# Patient Record
Sex: Male | Born: 1995
Health system: Southern US, Community
[De-identification: ages and names within clinical notes are randomized; demographics above are authoritative.]

## PROBLEM LIST (undated history)

## (undated) DIAGNOSIS — K469 Unspecified abdominal hernia without obstruction or gangrene: Secondary | ICD-10-CM

## (undated) DIAGNOSIS — J45909 Unspecified asthma, uncomplicated: Secondary | ICD-10-CM

---
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
## 2014-02-12 ENCOUNTER — Encounter (HOSPITAL_COMMUNITY): Payer: Self-pay | Admitting: Emergency Medicine

## 2014-02-12 ENCOUNTER — Emergency Department (HOSPITAL_COMMUNITY)
Admission: EM | Admit: 2014-02-12 | Discharge: 2014-02-12 | Disposition: A | Discharge status: Home / Self Care | Payer: Medicaid Other | Attending: Emergency Medicine | Admitting: Emergency Medicine

## 2014-02-12 DIAGNOSIS — J45909 Unspecified asthma, uncomplicated: Secondary | ICD-10-CM | POA: Insufficient documentation

## 2014-02-12 DIAGNOSIS — N4889 Other specified disorders of penis: Secondary | ICD-10-CM | POA: Diagnosis present

## 2014-02-12 DIAGNOSIS — Z8719 Personal history of other diseases of the digestive system: Secondary | ICD-10-CM | POA: Insufficient documentation

## 2014-02-12 DIAGNOSIS — Z79899 Other long term (current) drug therapy: Secondary | ICD-10-CM | POA: Insufficient documentation

## 2014-02-12 DIAGNOSIS — N342 Other urethritis: Secondary | ICD-10-CM | POA: Diagnosis not present

## 2014-02-12 MED ORDER — DOXYCYCLINE HYCLATE 100 MG PO TABS: 100.0000 mg | ORAL_TABLET | Freq: Two times a day (BID) | ORAL | Status: DC

## 2014-02-12 MED ORDER — CEFTRIAXONE SODIUM 250 MG IJ SOLR
250.0000 mg | Freq: Once | INTRAMUSCULAR | Status: AC
  Administered 2014-02-12: 250 mg via INTRAMUSCULAR
  Filled 2014-02-12: qty 250

## 2014-02-12 MED ORDER — LIDOCAINE HCL 2 % IJ SOLN
INTRAMUSCULAR | Status: AC
  Administered 2014-02-12: 20 mg
  Filled 2014-02-12: qty 20

## 2014-02-12 MED ORDER — AZITHROMYCIN 250 MG PO TABS
1000.0000 mg | ORAL_TABLET | Freq: Once | ORAL | Status: AC
  Administered 2014-02-12: 1000 mg via ORAL
  Filled 2014-02-12: qty 4

## 2014-02-12 NOTE — Discharge Instructions (Signed)
Sexually Transmitted Disease °A sexually transmitted disease (STD) is a disease or infection that may be passed (transmitted) from person to person, usually during sexual activity. This may happen by way of saliva, semen, blood, vaginal mucus, or urine. Common STDs include:  °· Gonorrhea.   °· Chlamydia.   °· Syphilis.   °· HIV and AIDS.   °· Genital herpes.   °· Hepatitis B and C.   °· Trichomonas.   °· Human papillomavirus (HPV).   °· Pubic lice.   °· Scabies. °· Mites. °· Bacterial vaginosis. °WHAT ARE CAUSES OF STDs? °An STD may be caused by bacteria, a virus, or parasites. STDs are often transmitted during sexual activity if one person is infected. However, they may also be transmitted through nonsexual means. STDs may be transmitted after:  °· Sexual intercourse with an infected person.   °· Sharing sex toys with an infected person.   °· Sharing needles with an infected person or using unclean piercing or tattoo needles. °· Having intimate contact with the genitals, mouth, or rectal areas of an infected person.   °· Exposure to infected fluids during birth. °WHAT ARE THE SIGNS AND SYMPTOMS OF STDs? °Different STDs have different symptoms. Some people may not have any symptoms. If symptoms are present, they may include:  °· Painful or bloody urination.   °· Pain in the pelvis, abdomen, vagina, anus, throat, or eyes.   °· A skin rash, itching, or irritation. °· Growths, ulcerations, blisters, or sores in the genital and anal areas. °· Abnormal vaginal discharge with or without bad odor.   °· Penile discharge in men.   °· Fever.   °· Pain or bleeding during sexual intercourse.   °· Swollen glands in the groin area.   °· Yellow skin and eyes (jaundice). This is seen with hepatitis.   °· Swollen testicles. °· Infertility. °· Sores and blisters in the mouth. °HOW ARE STDs DIAGNOSED? °To make a diagnosis, your health care provider may:  °· Take a medical history.   °· Perform a physical exam.   °· Take a sample of  any discharge to examine. °· Swab the throat, cervix, opening to the penis, rectum, or vagina for testing. °· Test a sample of your first morning urine.   °· Perform blood tests.   °· Perform a Pap test, if this applies.   °· Perform a colposcopy.   °· Perform a laparoscopy.   °HOW ARE STDs TREATED? ° Treatment depends on the STD. Some STDs may be treated but not cured.  °· Chlamydia, gonorrhea, trichomonas, and syphilis can be cured with antibiotic medicine.   °· Genital herpes, hepatitis, and HIV can be treated, but not cured, with prescribed medicines. The medicines lessen symptoms.   °· Genital warts from HPV can be treated with medicine or by freezing, burning (electrocautery), or surgery. Warts may come back.   °· HPV cannot be cured with medicine or surgery. However, abnormal areas may be removed from the cervix, vagina, or vulva.   °· If your diagnosis is confirmed, your recent sexual partners need treatment. This is true even if they are symptom-free or have a negative culture or evaluation. They should not have sex until their health care providers say it is okay. °HOW CAN I REDUCE MY RISK OF GETTING AN STD? °Take these steps to reduce your risk of getting an STD: °· Use latex condoms, dental dams, and water-soluble lubricants during sexual activity. Do not use petroleum jelly or oils. °· Avoid having multiple sex partners. °· Do not have sex with someone who has other sex partners. °· Do not have sex with anyone you do not know or who is at   high risk for an STD.  Avoid risky sex practices that can break your skin.  Do not have sex if you have open sores on your mouth or skin.  Avoid drinking too much alcohol or taking illegal drugs. Alcohol and drugs can affect your judgment and put you in a vulnerable position.  Avoid engaging in oral and anal sex acts.  Get vaccinated for HPV and hepatitis. If you have not received these vaccines in the past, talk to your health care provider about whether one  or both might be right for you.   If you are at risk of being infected with HIV, it is recommended that you take a prescription medicine daily to prevent HIV infection. This is called pre-exposure prophylaxis (PrEP). You are considered at risk if:  You are a man who has sex with other men (MSM).  You are a heterosexual man or woman and are sexually active with more than one partner.  You take drugs by injection.  You are sexually active with a partner who has HIV.  Talk with your health care provider about whether you are at high risk of being infected with HIV. If you choose to begin PrEP, you should first be tested for HIV. You should then be tested every 3 months for as long as you are taking PrEP.  WHAT SHOULD I DO IF I THINK I HAVE AN STD?  See your health care provider.   Tell your sexual partner(s). They should be tested and treated for any STDs.  Do not have sex until your health care provider says it is okay. WHEN SHOULD I GET IMMEDIATE MEDICAL CARE? Contact your health care provider right away if:   You have severe abdominal pain.  You are a man and notice swelling or pain in your testicles.  You are a woman and notice swelling or pain in your vagina. Document Released: 03/27/2002 Document Revised: 01/09/2013 Document Reviewed: 07/25/2012 [facility] Patient Information 2015 [place], [place]. This information is not intended to replace advice given to you by your health care provider. Make sure you discuss any questions you have with your health care provider. Epididymitis Epididymitis is a swelling (inflammation) of the epididymis. The epididymis is a cord-like structure along the back part of the testicle. Epididymitis is usually, but not always, caused by infection. This is usually a sudden problem beginning with chills, fever and pain behind the scrotum and in the testicle. There may be swelling and redness of the testicle. DIAGNOSIS  Physical examination will reveal a  tender, swollen epididymis. Sometimes, cultures are obtained from the urine or from prostate secretions to help find out if there is an infection or if the cause is a different problem. Sometimes, blood work is performed to see if your white blood cell count is elevated and if a germ (bacterial) or viral infection is present. Using this knowledge, an appropriate medicine which kills germs (antibiotic) can be chosen by your caregiver. A viral infection causing epididymitis will most often go away (resolve) without treatment. HOME CARE INSTRUCTIONS   Hot sitz baths for 20 minutes, 4 times per day, may help relieve pain.  Only take over-the-counter or prescription medicines for pain, discomfort or fever as directed by your caregiver.  Take all medicines, including antibiotics, as directed. Take the antibiotics for the full prescribed length of time even if you are feeling better.  It is very important to keep all follow-up appointments. SEEK IMMEDIATE MEDICAL CARE IF:   You have a  fever.  You have pain not relieved with medicines.  You have any worsening of your problems.  Your pain seems to come and go.  You develop pain, redness, and swelling in the scrotum and surrounding areas. MAKE SURE YOU:   Understand these instructions.  Will watch your condition.  Will get help right away if you are not doing well or get worse. Document Released: 01/02/2000 Document Revised: 03/29/2011 Document Reviewed: 11/21/2008 [facility] Patient Information 2015 [place], [place]. This information is not intended to replace advice given to you by your health care provider. Make sure you discuss any questions you have with your health care provider.

## 2014-02-12 NOTE — ED Notes (Signed)
Pt c/o pain, tension, and numbness to scrotum, noted burning and clear discharge x 4 days.

## 2014-02-12 NOTE — ED Provider Notes (Signed)
CSN: [id number]     Arrival date & time 02/12/14  1312 History  This chart was scribed for non-physician practitioner, Arthor CaptainAbigail Marquesa Rath, PA-C working with Toy BakerAnthony T Allen, MD by Freida Busmaniana Omoyeni, ED Scribe. This patient was seen in room WTR5/WTR5 and the patient's care was started at 3:28 PM.    No chief complaint on file.   The history is provided by the patient. No language interpreter was used.     HPI Comments:  Richard Ortiz is a 19 y.o. male who presents to the Emergency Department complaining of moderate penile pain for 3-4 days. He reports associated penile discharge, scrotal pain and numbness to his penile. Pt admits to having unprotected sex with girlfriend; denies other sexual partners. He denies lesions and rash to his penis. No alleviating factors noted.   Past Medical History  Diagnosis Date  . Asthma   . Abdominal hernia    History reviewed. No pertinent past surgical history. Family History  Problem Relation Age of Onset  . Hypertension Maternal Grandmother    History  Substance Use Topics  . Smoking status: Never Smoker   . Smokeless tobacco: Never Used  . Alcohol Use: No    Review of Systems  Constitutional: Negative for fever and chills.  Genitourinary: Positive for discharge, penile pain and testicular pain. Negative for flank pain.  Skin: Negative for rash.  All other systems reviewed and are negative.     Allergies  Review of patient's allergies indicates no known allergies.  Home Medications   Prior to Admission medications   Medication Sig Start Date End Date Taking? Authorizing Provider  HYDROcodone-acetaminophen (NORCO/VICODIN) 5-325 MG per tablet Take 1-2 tablets by mouth every 4 (four) hours as needed for moderate pain or severe pain. 07/26/13   Hannah Muthersbaugh, PA-C  OVER THE COUNTER MEDICATION Take 1 tablet by mouth as needed (for constipation). "Laxative"    Historical Provider, MD  polyethylene glycol (MIRALAX / GLYCOLAX) packet  Take 17 g by mouth daily as needed for mild constipation or moderate constipation. 07/26/13   Hannah Muthersbaugh, PA-C  promethazine (PHENERGAN) 25 MG tablet Take 1 tablet (25 mg total) by mouth every 6 (six) hours as needed for nausea or vomiting. 07/26/13   Hannah Muthersbaugh, PA-C   BP 149/88 mmHg  Temp(Src) 98.2 F (36.8 C) (Oral)  Resp 18  SpO2 100% Physical Exam  Constitutional: He is oriented to person, place, and time. He appears well-developed and well-nourished.  HENT:  Head: Normocephalic and atraumatic.  Cardiovascular: Normal rate.   Pulmonary/Chest: Effort normal.  Abdominal: He exhibits no distension.  Genitourinary: Penis normal.  normal male genitalia circumcised Discharge present at urethral meatus Pain present to bilateral testicles  Neurological: He is alert and oriented to person, place, and time.  Skin: Skin is warm and dry.  Psychiatric: He has a normal mood and affect.  Nursing note and vitals reviewed.   ED Course  Procedures   DIAGNOSTIC STUDIES:  Oxygen Saturation is 100% on RA, normal by my interpretation.    COORDINATION OF CARE:  3:34 PM Discussed treatment plan with pt at bedside and pt agreed to plan.  Labs Review Labs Reviewed  GC/CHLAMYDIA PROBE AMP ([place])    Imaging Review No results found.   EKG Interpretation None      MDM   Final diagnoses:  Urethritis   Patient with urethral discharge. He is bilateral epididymal tenderness. Patient will be treated here with Rocephin, azithromycin and be discharged with 10 days  of doxycycline 100 mg twice a day. The safe for discharge at this time.  I personally performed the services described in this documentation, which was scribed in my presence. The recorded information has been reviewed and is accurate.     Arthor Captain, PA-C 02/12/14 1546  Toy Baker, MD 02/15/14 [phone]

## 2014-02-13 LAB — GC/CHLAMYDIA PROBE AMP (~~LOC~~) NOT AT ARMC
CHLAMYDIA, DNA PROBE: POSITIVE — AB
Neisseria Gonorrhea: NEGATIVE

## 2014-02-17 ENCOUNTER — Telehealth (HOSPITAL_BASED_OUTPATIENT_CLINIC_OR_DEPARTMENT_OTHER): Payer: Self-pay | Admitting: Emergency Medicine

## 2014-02-17 NOTE — Telephone Encounter (Signed)
Positive Chlamydia Treated with Rocephin and Zithromax per protocol MD [facility] faxed

## 2014-02-18 ENCOUNTER — Telehealth: Payer: Self-pay | Admitting: *Deleted

## 2014-03-04 ENCOUNTER — Telehealth (HOSPITAL_COMMUNITY): Payer: Self-pay

## 2014-03-04 NOTE — ED Notes (Signed)
Unable to contact pt by mail or telephone. Unable to communicate lab results or treatment changes. 

## 2015-02-10 ENCOUNTER — Encounter (HOSPITAL_COMMUNITY): Payer: Self-pay | Admitting: Emergency Medicine

## 2015-02-10 ENCOUNTER — Emergency Department (HOSPITAL_COMMUNITY): Payer: Medicaid Other

## 2015-02-10 ENCOUNTER — Emergency Department (HOSPITAL_COMMUNITY)
Admission: EM | Admit: 2015-02-10 | Discharge: 2015-02-10 | Disposition: A | Discharge status: Home / Self Care | Payer: Medicaid Other | Attending: Emergency Medicine | Admitting: Emergency Medicine

## 2015-02-10 DIAGNOSIS — S29001A Unspecified injury of muscle and tendon of front wall of thorax, initial encounter: Secondary | ICD-10-CM | POA: Diagnosis not present

## 2015-02-10 DIAGNOSIS — J45909 Unspecified asthma, uncomplicated: Secondary | ICD-10-CM | POA: Insufficient documentation

## 2015-02-10 DIAGNOSIS — Y92410 Unspecified street and highway as the place of occurrence of the external cause: Secondary | ICD-10-CM | POA: Diagnosis not present

## 2015-02-10 DIAGNOSIS — Y9389 Activity, other specified: Secondary | ICD-10-CM | POA: Diagnosis not present

## 2015-02-10 DIAGNOSIS — S3992XA Unspecified injury of lower back, initial encounter: Secondary | ICD-10-CM | POA: Diagnosis not present

## 2015-02-10 DIAGNOSIS — Z79899 Other long term (current) drug therapy: Secondary | ICD-10-CM | POA: Insufficient documentation

## 2015-02-10 DIAGNOSIS — Y998 Other external cause status: Secondary | ICD-10-CM | POA: Diagnosis not present

## 2015-02-10 MED ORDER — IBUPROFEN 800 MG PO TABS
800.0000 mg | ORAL_TABLET | Freq: Once | ORAL | Status: AC
  Administered 2015-02-10: 800 mg via ORAL
  Filled 2015-02-10: qty 1

## 2015-02-10 MED ORDER — METHOCARBAMOL 500 MG PO TABS
1000.0000 mg | ORAL_TABLET | Freq: Once | ORAL | Status: AC
  Administered 2015-02-10: 1000 mg via ORAL
  Filled 2015-02-10: qty 2

## 2015-02-10 MED ORDER — IBUPROFEN 800 MG PO TABS: 800.0000 mg | ORAL_TABLET | Freq: Three times a day (TID) | ORAL | Status: DC

## 2015-02-10 MED ORDER — METHOCARBAMOL 500 MG PO TABS: 500.0000 mg | ORAL_TABLET | Freq: Two times a day (BID) | ORAL | Status: DC
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
## 2015-02-10 NOTE — ED Notes (Signed)
Verbalized understanding discharge instructions. In no acute distress.   

## 2015-04-11 ENCOUNTER — Emergency Department (HOSPITAL_COMMUNITY): Payer: Medicaid Other

## 2015-04-11 ENCOUNTER — Encounter (HOSPITAL_COMMUNITY): Payer: Self-pay

## 2015-04-11 ENCOUNTER — Emergency Department (HOSPITAL_COMMUNITY)
Admission: EM | Admit: 2015-04-11 | Discharge: 2015-04-12 | Disposition: A | Discharge status: Home / Self Care | Payer: Medicaid Other | Attending: Emergency Medicine | Admitting: Emergency Medicine

## 2015-04-11 DIAGNOSIS — S62314A Displaced fracture of base of fourth metacarpal bone, right hand, initial encounter for closed fracture: Secondary | ICD-10-CM | POA: Diagnosis not present

## 2015-04-11 DIAGNOSIS — Z792 Long term (current) use of antibiotics: Secondary | ICD-10-CM | POA: Insufficient documentation

## 2015-04-11 DIAGNOSIS — Y9289 Other specified places as the place of occurrence of the external cause: Secondary | ICD-10-CM | POA: Insufficient documentation

## 2015-04-11 DIAGNOSIS — Y9389 Activity, other specified: Secondary | ICD-10-CM | POA: Diagnosis not present

## 2015-04-11 DIAGNOSIS — S62316A Displaced fracture of base of fifth metacarpal bone, right hand, initial encounter for closed fracture: Secondary | ICD-10-CM | POA: Insufficient documentation

## 2015-04-11 DIAGNOSIS — S61412A Laceration without foreign body of left hand, initial encounter: Secondary | ICD-10-CM | POA: Diagnosis not present

## 2015-04-11 DIAGNOSIS — J45909 Unspecified asthma, uncomplicated: Secondary | ICD-10-CM | POA: Insufficient documentation

## 2015-04-11 DIAGNOSIS — Y998 Other external cause status: Secondary | ICD-10-CM | POA: Diagnosis not present

## 2015-04-11 DIAGNOSIS — Z791 Long term (current) use of non-steroidal anti-inflammatories (NSAID): Secondary | ICD-10-CM | POA: Insufficient documentation

## 2015-04-11 DIAGNOSIS — Z23 Encounter for immunization: Secondary | ICD-10-CM | POA: Diagnosis not present

## 2015-04-11 DIAGNOSIS — Z8719 Personal history of other diseases of the digestive system: Secondary | ICD-10-CM | POA: Diagnosis not present

## 2015-04-11 DIAGNOSIS — Z79899 Other long term (current) drug therapy: Secondary | ICD-10-CM | POA: Insufficient documentation

## 2015-04-11 DIAGNOSIS — S62339A Displaced fracture of neck of unspecified metacarpal bone, initial encounter for closed fracture: Secondary | ICD-10-CM

## 2015-04-11 DIAGNOSIS — W2201XA Walked into wall, initial encounter: Secondary | ICD-10-CM | POA: Insufficient documentation

## 2015-04-11 DIAGNOSIS — S60511A Abrasion of right hand, initial encounter: Secondary | ICD-10-CM | POA: Diagnosis not present

## 2015-04-11 DIAGNOSIS — S6991XA Unspecified injury of right wrist, hand and finger(s), initial encounter: Secondary | ICD-10-CM | POA: Diagnosis present
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
## 2015-04-11 NOTE — ED Notes (Signed)
Patient transported to X-ray 

## 2015-04-12 MED ORDER — HYDROCODONE-ACETAMINOPHEN 5-325 MG PO TABS: 1.0000 | ORAL_TABLET | Freq: Four times a day (QID) | ORAL | Status: DC | PRN

## 2015-04-12 MED ORDER — HYDROCODONE-ACETAMINOPHEN 5-325 MG PO TABS
2.0000 | ORAL_TABLET | Freq: Once | ORAL | Status: AC
  Administered 2015-04-12: 2 via ORAL
  Filled 2015-04-12: qty 2

## 2015-04-12 MED ORDER — TETANUS-DIPHTH-ACELL PERTUSSIS 5-2.5-18.5 LF-MCG/0.5 IM SUSP
0.5000 mL | Freq: Once | INTRAMUSCULAR | Status: AC
  Administered 2015-04-12: 0.5 mL via INTRAMUSCULAR
  Filled 2015-04-12: qty 0.5

## 2015-04-12 MED ORDER — BACITRACIN ZINC 500 UNIT/GM EX OINT
1.0000 "application " | TOPICAL_OINTMENT | Freq: Two times a day (BID) | CUTANEOUS | Status: DC
  Administered 2015-04-12: 1 via TOPICAL

## 2015-04-12 NOTE — Discharge Instructions (Signed)
Boxer's Fracture °A boxer's fracture is a break (fracture) of the bone in your hand that connects your little finger to your wrist (fifth metacarpal). This type of fracture usually happens at the end of the bone, closest to the little finger. The knuckle is often pushed down by the impact. °In some cases, only a splint or brace is needed, or you may need a cast. Casting or splinting may include taping your injured finger to the next finger (buddy taping). You may need surgery to repair the fracture. This may involve the use of wires, screws, or plates to hold the bone pieces in place.  °CAUSES °This injury may be caused by:  °· Hitting an object with a clenched fist. °· A hard, direct hit to the hand. °· An injury that crushes the hand. °RISK FACTORS °This injury is more likely to occur if: °· You are in a fistfight. °· You have certain bone diseases. °SYMPTOMS °Symptoms of this type of fracture develop soon after the injury. Symptoms may include: °· Swelling of the hand. °· Pain. °· Pain when moving the fifth finger or touching the hand. °· Abnormal position of the finger. °· Not being able to move the finger. °· A shortened finger. °· A finger knuckle that looks sunken in. °DIAGNOSIS °This injury may be diagnosed based on your symptoms, especially if you had a recent hand injury. Your health care provider will perform a physical exam, and you may also have X-rays to confirm the diagnosis. °TREATMENT  °Treatment for this injury depends on how severe it is. Possible treatments include: °· Closed reduction. If your bone is stable and can be moved back into place, you may only need to wear a cast or splint or have buddy taping. °· Open reduction with internal fixation (ORIF). This may be needed if your fracture is far out of place or goes through the joint surface of the bone. This treatment involves open surgery to move your bones back into the right position. Screws, wires, or plates may be used to stabilize the  fracture. °You may need to wear a cast or a splint for several weeks. You will also need to have follow-up X-rays to make sure that the bone is healing well and staying in position. After you no longer need the cast or splint, you may need physical therapy. This will help you to regain full movement and strength in your hand.  °HOME CARE INSTRUCTIONS °If You Have a Cast: °· Do not stick anything inside the cast to scratch your skin. Doing that increases your risk of infection. °· Check the skin around the cast every day. Report any concerns to your health care provider. You may put lotion on dry skin around the edges of the cast. Do not apply lotion to the skin underneath the cast. °If You Have a Splint: °· Wear it as directed by your health care provider. Remove it only as directed by your health care provider. °· Loosen the splint if your fingers become numb and tingle, or if they turn cold and blue. °Bathing °· Cover the cast or splint with a watertight plastic bag to protect it from water while you take a bath or a shower. Do not let the cast or splint get wet. °Managing Pain, Stiffness, and Swelling °· If directed, apply ice to the injured area (if you have a splint, not a cast): °¨ Put ice in a plastic bag. °¨ Place a towel between your skin and the bag. °¨   Leave the ice on for 20 minutes, 2-3 times a day. °· Move your fingers often to avoid stiffness and to lessen swelling. °· Raise the injured area above the level of your heart while you are sitting or lying down. °Driving °· Do not drive or operate heavy machinery while taking pain medicine. °· Do not drive while wearing a cast or splint on a hand or foot that you use for driving. °Activity °· Return to your normal activities as directed by your health care provider. Ask your health care provider what activities are safe for you. °General Instructions °· Do not put pressure on any part of the cast or splint until it is fully hardened. This may take several  hours. °· Keep the cast or splint clean and dry. °· Do not use any tobacco products, including cigarettes, chewing tobacco, or electronic cigarettes. Tobacco can delay bone healing. If you need help quitting, ask your health care provider. °· Take medicines only as directed by your health care provider. °· Keep all follow-up visits as directed by your health care provider. This is important. °SEEK MEDICAL CARE IF: °· Your pain is getting worse. °· You have redness, swelling, or pain in the injured area. °· You have fluid, blood, or pus coming from under your cast or splint. °· You notice a bad smell coming from under your cast or splint. °· You have a fever. °· Your cast or splint feels too tight or too loose. °· You cast is coming apart. °SEEK IMMEDIATE MEDICAL CARE IF: °· You develop a rash. °· You have trouble breathing. °· Your skin or nails on your injured hand turn blue or gray even after you loosen your splint. °· Your injured hand feels cold or becomes numb even after you loosen your splint. °· You develop severe pain under the cast or in your hand. °  °This information is not intended to replace advice given to you by your health care provider. Make sure you discuss any questions you have with your health care provider. °  °Document Released: 01/04/2005 Document Revised: 09/25/2014 Document Reviewed: 10/24/2013 °Elsevier Interactive Patient Education ©2016 [employer] ° °

## 2015-04-12 NOTE — ED Notes (Signed)
Pt ambulatory and independent at discharge.  

## 2015-04-12 NOTE — ED Notes (Signed)
Ortho paged and informed of need for splint

## 2015-04-12 NOTE — ED Provider Notes (Signed)
CSN: [id number]     Arrival date & time 04/11/15  2329 History   First MD Initiated Contact with Patient 04/11/15 2336     Chief Complaint  Patient presents with  . Hand Injury     (Consider location/radiation/quality/duration/timing/severity/associated sxs/prior Treatment) HPI Comments: Patient presents to the emergency department with chief complaint of bilateral hand pain. States that he punched a wall to night with both his hands. Complains pain on the right hand worse than left hand. Pain is worsened with movement and palpation. He has not taken anything for her symptoms. There are no other associated symptoms.  Last tdap unknown.  The history is provided by the patient. No language interpreter was used.    Past Medical History  Diagnosis Date  . Asthma   . Abdominal hernia    History reviewed. No pertinent past surgical history. Family History  Problem Relation Age of Onset  . Hypertension Maternal Grandmother    Social History  Substance Use Topics  . Smoking status: Never Smoker   . Smokeless tobacco: Never Used  . Alcohol Use: No    Review of Systems  All other systems reviewed and are negative.     Allergies  Review of patient's allergies indicates no known allergies.  Home Medications   Prior to Admission medications   Medication Sig Start Date End Date Taking? Authorizing Provider  doxycycline (VIBRA-TABS) 100 MG tablet Take 1 tablet (100 mg total) by mouth 2 (two) times daily. 02/12/14   Arthor Captain, PA-C  HYDROcodone-acetaminophen (NORCO/VICODIN) 5-325 MG tablet Take 1-2 tablets by mouth every 6 (six) hours as needed. 04/12/15   Roxy Horseman, PA-C  ibuprofen (ADVIL,MOTRIN) 800 MG tablet Take 1 tablet (800 mg total) by mouth 3 (three) times daily. 02/10/15   Melton Krebs, PA-C  methocarbamol (ROBAXIN) 500 MG tablet Take 1 tablet (500 mg total) by mouth 2 (two) times daily. 02/10/15   Melton Krebs, PA-C  OVER THE COUNTER MEDICATION Take  1 tablet by mouth as needed (for constipation). "Laxative"    Historical Provider, MD  polyethylene glycol (MIRALAX / GLYCOLAX) packet Take 17 g by mouth daily as needed for mild constipation or moderate constipation. 07/26/13   Hannah Muthersbaugh, PA-C  promethazine (PHENERGAN) 25 MG tablet Take 1 tablet (25 mg total) by mouth every 6 (six) hours as needed for nausea or vomiting. 07/26/13   Hannah Muthersbaugh, PA-C   BP 123/91 mmHg  Pulse 99  Temp(Src) 98.8 F (37.1 C) (Oral)  Resp 16  Ht  (1.626 m)  Wt 64.864 kg  BMI 24.53 kg/m2  SpO2 100% Physical Exam  Constitutional: He is oriented to person, place, and time. He appears well-developed and well-nourished.  HENT:  Head: Normocephalic and atraumatic.  Eyes: Conjunctivae and EOM are normal. Pupils are equal, round, and reactive to light. Right eye exhibits no discharge. Left eye exhibits no discharge. No scleral icterus.  Neck: Normal range of motion. Neck supple. No JVD present.  Cardiovascular: Normal rate, regular rhythm, normal heart sounds and intact distal pulses.  Exam reveals no gallop and no friction rub.   No murmur heard. Intact distal pulses, brisk cap refill  Pulmonary/Chest: Effort normal and breath sounds normal. No respiratory distress. He has no wheezes. He has no rales. He exhibits no tenderness.  Abdominal: Soft. He exhibits no distension and no mass. There is no tenderness. There is no rebound and no guarding.  Musculoskeletal: He exhibits tenderness. He exhibits no edema.  Right hand: ROM  and strength limited 2/2 pain Left hand: ROM and strength 5/5 Mild abrasions on dorsal aspect of right hand 1 cm laceration over left 3rd MCP  Neurological: He is alert and oriented to person, place, and time.  Skin: Skin is warm and dry.  Psychiatric: He has a normal mood and affect. His behavior is normal. Judgment and thought content normal.  Nursing note and vitals reviewed.   ED Course  Procedures (including critical  care time) Labs Review Labs Reviewed - No data to display  Imaging Review Dg Hand Complete Left  04/12/2015  CLINICAL DATA:  Left hand pain. Punched a wall a couple of hours ago. EXAM: LEFT HAND - COMPLETE 3+ VIEW COMPARISON:  None. FINDINGS: There is no evidence of fracture or dislocation. There is no evidence of arthropathy or other focal bone abnormality. Soft tissues are unremarkable. IMPRESSION: Negative. Electronically Signed   By: Burman NievesWilliam  Stevens M.D.   On: 04/12/2015 00:13   Dg Hand Complete Right  04/12/2015  CLINICAL DATA:  20 year old male with right hand pain. Punched a wall EXAM: RIGHT HAND - COMPLETE 3+ VIEW COMPARISON:  None. FINDINGS: There is a displaced fracture of the base of the fourth metacarpal with apparent extension of the fracture into the carpometacarpal articulation. It is a fracture fragment is also noted adjacent to the base of the fifth metacarpal and posterior aspect of the hamate. This likely represents a fracture fragment from the base of the fifth metacarpal. The bones are well mineralized There is soft tissue swelling of the volar aspect of the hand. No radiopaque foreign object identified. IMPRESSION: Displaced fractures of the bases of the fourth and fifth metacarpals. Electronically Signed   By: Elgie CollardArash  Radparvar M.D.   On: 04/12/2015 00:18   I have personally reviewed and evaluated these images and lab results as part of my medical decision-making.   LACERATION REPAIR Performed by: Roxy HorsemanBROWNING, Adlean Hardeman Authorized by: Roxy HorsemanBROWNING, Sreenidhi Ganson Consent: Verbal consent obtained. Risks and benefits: risks, benefits and alternatives were discussed Consent given by: patient Patient identity confirmed: provided demographic data Prepped and Draped in normal sterile fashion Wound explored  Laceration Location: left 3 MCP  Laceration Length: 1cm  No Foreign Bodies seen or palpated  Anesthesia: none  Local anesthetic: none Anesthetic total: none Irrigation method:  wound cleaner Amount of cleaning: standard  Skin closure: dermabond  Number of sutures: dermabond  Technique: dermabond  Patient tolerance: Patient tolerated the procedure well with no immediate complications.    MDM   Final diagnoses:  Boxer's fracture, closed, initial encounter    Patient with bilateral hand pain.    Plain films as above.  Ulnar gutter for right hand.  Recommend hand follow-up.  Patient understands and agrees with the plan.    Roxy Horsemanobert Galya Dunnigan, PA-C 04/12/15 0047  Layla MawKristen N Ward, DO 04/12/15 [id number]

## 2015-04-12 NOTE — ED Notes (Signed)
Ortho at bedside applying splint

## 2015-04-12 NOTE — ED Notes (Signed)
Wound cleaner applied to both knuckles and bacitracin applied to open areas.    PA at bedside applying dermabond at this time

## 2015-04-15 ENCOUNTER — Emergency Department (HOSPITAL_COMMUNITY)
Admission: EM | Admit: 2015-04-15 | Discharge: 2015-04-15 | Disposition: A | Discharge status: Home / Self Care | Payer: Medicaid Other | Attending: Emergency Medicine | Admitting: Emergency Medicine

## 2015-04-15 ENCOUNTER — Encounter (HOSPITAL_COMMUNITY): Payer: Self-pay | Admitting: Emergency Medicine

## 2015-04-15 DIAGNOSIS — Z792 Long term (current) use of antibiotics: Secondary | ICD-10-CM | POA: Diagnosis not present

## 2015-04-15 DIAGNOSIS — Z09 Encounter for follow-up examination after completed treatment for conditions other than malignant neoplasm: Secondary | ICD-10-CM | POA: Diagnosis present

## 2015-04-15 DIAGNOSIS — S92341S Displaced fracture of fourth metatarsal bone, right foot, sequela: Secondary | ICD-10-CM | POA: Insufficient documentation

## 2015-04-15 DIAGNOSIS — S62306S Unspecified fracture of fifth metacarpal bone, right hand, sequela: Secondary | ICD-10-CM | POA: Diagnosis not present

## 2015-04-15 DIAGNOSIS — Z791 Long term (current) use of non-steroidal anti-inflammatories (NSAID): Secondary | ICD-10-CM | POA: Diagnosis not present

## 2015-04-15 DIAGNOSIS — Z8719 Personal history of other diseases of the digestive system: Secondary | ICD-10-CM | POA: Insufficient documentation

## 2015-04-15 DIAGNOSIS — J45909 Unspecified asthma, uncomplicated: Secondary | ICD-10-CM | POA: Insufficient documentation

## 2015-04-15 DIAGNOSIS — S62308S Unspecified fracture of other metacarpal bone, sequela: Secondary | ICD-10-CM

## 2015-04-15 DIAGNOSIS — Z79899 Other long term (current) drug therapy: Secondary | ICD-10-CM | POA: Insufficient documentation

## 2015-04-15 DIAGNOSIS — W2209XS Striking against other stationary object, sequela: Secondary | ICD-10-CM | POA: Insufficient documentation

## 2015-04-15 DIAGNOSIS — S92301S Fracture of unspecified metatarsal bone(s), right foot, sequela: Secondary | ICD-10-CM

## 2015-04-15 MED ORDER — HYDROCODONE-ACETAMINOPHEN 5-325 MG PO TABS: 1.0000 | ORAL_TABLET | Freq: Four times a day (QID) | ORAL | Status: DC | PRN

## 2015-04-15 MED ORDER — DICLOFENAC SODIUM 50 MG PO TBEC: 50.0000 mg | DELAYED_RELEASE_TABLET | Freq: Two times a day (BID) | ORAL | Status: DC

## 2015-04-15 NOTE — ED Provider Notes (Signed)
History  By signing my name below, I, Karle PlumberJennifer Tensley, attest that this documentation has been prepared under the direction and in the presence of Langston MaskerKaren Coraline Talwar, [place]PA-C. Electronically Signed: Karle PlumberJennifer Tensley, ED Scribe. 04/15/2015. 11:31 AM.  Chief Complaint  Patient presents with  . Follow-up   The history is provided by the patient and medical records. No language interpreter was used.    HPI Comments:  Richard Ortiz is a 20 y.o. male who presents to the Emergency Department complaining of right hand pain last night. He states he injured the right hand about 3-4 days ago secondary to punching a wall. He states the pain became worse last night. He has not taken anything for pain. He denies modifying factors. He denies any new trauma or injury. He denies fever, chills, nausea or vomiting.   Past Medical History  Diagnosis Date  . Asthma   . Abdominal hernia    History reviewed. No pertinent past surgical history. Family History  Problem Relation Age of Onset  . Hypertension Maternal Grandmother    Social History  Substance Use Topics  . Smoking status: Never Smoker   . Smokeless tobacco: Never Used  . Alcohol Use: No    Review of Systems  Musculoskeletal: Positive for joint swelling and arthralgias.  Skin: Positive for color change and wound.  All other systems reviewed and are negative.   Allergies  Review of patient's allergies indicates no known allergies.  Home Medications   Prior to Admission medications   Medication Sig Start Date End Date Taking? Authorizing Provider  doxycycline (VIBRA-TABS) 100 MG tablet Take 1 tablet (100 mg total) by mouth 2 (two) times daily. 02/12/14   Arthor CaptainAbigail Harris, PA-C  HYDROcodone-acetaminophen (NORCO/VICODIN) 5-325 MG tablet Take 1-2 tablets by mouth every 6 (six) hours as needed. 04/12/15   Roxy Horsemanobert Browning, PA-C  ibuprofen (ADVIL,MOTRIN) 800 MG tablet Take 1 tablet (800 mg total) by mouth 3 (three) times daily. 02/10/15    Melton KrebsSamantha Nicole Riley, PA-C  methocarbamol (ROBAXIN) 500 MG tablet Take 1 tablet (500 mg total) by mouth 2 (two) times daily. 02/10/15   Melton KrebsSamantha Nicole Riley, PA-C  OVER THE COUNTER MEDICATION Take 1 tablet by mouth as needed (for constipation). "Laxative"    Historical Provider, MD  polyethylene glycol (MIRALAX / GLYCOLAX) packet Take 17 g by mouth daily as needed for mild constipation or moderate constipation. 07/26/13   Hannah Muthersbaugh, PA-C  promethazine (PHENERGAN) 25 MG tablet Take 1 tablet (25 mg total) by mouth every 6 (six) hours as needed for nausea or vomiting. 07/26/13   Hannah Muthersbaugh, PA-C   Triage Vitals: BP 130/70 mmHg  Pulse 85  Temp(Src) 97.7 F (36.5 C) (Oral)  Resp 16  SpO2 100% Physical Exam  Constitutional: He is oriented to person, place, and time. He appears well-developed and well-nourished.  HENT:  Head: Normocephalic and atraumatic.  Eyes: EOM are normal.  Neck: Normal range of motion.  Cardiovascular: Normal rate.   Pulmonary/Chest: Effort normal.  Musculoskeletal: Normal range of motion.  Neurological: He is alert and oriented to person, place, and time.  Skin: Skin is warm and dry.  Superficial abrasions to distal phalanges and metacarpal heads of right hand. Edema and ecchymosis present.  Psychiatric: He has a normal mood and affect. His behavior is normal.  Nursing note and vitals reviewed.   ED Course  Procedures (including critical care time) DIAGNOSTIC STUDIES: Oxygen Saturation is 100% on RA, normal by my interpretation.   COORDINATION OF CARE: 11:31 AM-  Will re-splint hand. Pt verbalizes understanding and agrees to plan.  Medications - No data to display   MDM   Final diagnoses:  Fracture of 4th metatarsal, right, sequela  Closed fracture of 5th metacarpal, sequela    Splint replaced voltaren hydrococdone Follow up with Dr. Amanda Pea as directed An After Visit Summary was printed and given to the patient.   Lonia Skinner Conway,  PA-C 04/15/15 1233  Alvira Monday, MD 04/15/15 2108

## 2015-04-15 NOTE — ED Notes (Signed)
Pt was here for hand fx on 04/12/15. States he is out of pain meds and  "I lost my paperwork".

## 2015-04-15 NOTE — Discharge Instructions (Signed)
Metacarpal Fracture °A metacarpal fracture is a break (fracture) of a bone in the hand. Metacarpals are the bones that extend from your knuckles to your wrist. In each hand, you have five metacarpal bones that connect your fingers and your thumb to your wrist. °Some hand fractures have bone pieces that are close together and stable (simple). These fractures may be treated with only a splint or cast. Hand fractures that have many pieces of broken bone (comminuted), unstable bone pieces (displaced), or a bone that breaks through the skin (compound) usually require surgery. °CAUSES °This injury may be caused by: °· A fall. °· A hard, direct hit to your hand. °· An injury that squeezes your knuckle, stretches your finger out of place, or crushes your hand. °RISK FACTORS °This injury is more likely to occur if: °· You play contact sports. °· You have certain bone diseases. °SYMPTOMS  °Symptoms of this type of fracture develop soon after the injury. Symptoms may include: °· Swelling. °· Pain. °· Stiffness. °· Increased pain with movement. °· Bruising. °· Inability to move a finger. °· A shortened finger. °· A finger knuckle that looks sunken in. °· Unusual appearance of the hand or finger (deformity). °DIAGNOSIS  °This injury may be diagnosed based on your signs and symptoms, especially if you had a recent hand injury. Your health care provider will perform a physical exam. He or she may also order X-rays to confirm the diagnosis.  °TREATMENT  °Treatment for this injury depends on the type of fracture you have and how severe it is. Possible treatments include: °· Non-reduction. This can be done if the bone does not need to be moved back into place. The fracture can be casted or splinted as it is.   °· Closed reduction. If your bone is stable and can be moved back into place, you may only need to wear a cast or splint or have buddy taping. °· Closed reduction with internal fixation (CRIF). This is the most common  treatment. You may have this procedure if your bone can be moved back into place but needs more support. Wires, pins, or screws may be inserted through your skin to stabilize the fracture. °· Open reduction with internal fixation (ORIF). This may be needed if your fracture is severe and unstable. It involves surgery to move your bone back into the right position. Screws, wires, or plates are used to stabilize the fracture. °After all procedures, you may need to wear a cast or a splint for several weeks. You will also need to have follow-up X-rays to make sure that the bone is healing well and staying in position. After you no longer need your cast or splint, you may need physical therapy. This will help you to regain full movement and strength in your hand.  °HOME CARE INSTRUCTIONS  °If You Have a Cast: °· Do not stick anything inside the cast to scratch your skin. Doing that increases your risk of infection. °· Check the skin around the cast every day. Report any concerns to your health care provider. You may put lotion on dry skin around the edges of the cast. Do not apply lotion to the skin underneath the cast. °If You Have a Splint: °· Wear it as directed by your health care provider. Remove it only as directed by your health care provider. °· Loosen the splint if your fingers become numb and tingle, or if they turn cold and blue. °Bathing °· Cover the cast or splint with a   watertight plastic bag to protect it from water while you take a bath or a shower. Do not let the cast or splint get wet. °Managing Pain, Stiffness, and Swelling °· If directed, apply ice to the injured area (if you have a splint, not a cast): °¨ Put ice in a plastic bag. °¨ Place a towel between your skin and the bag. °¨ Leave the ice on for 20 minutes, 2-3 times a day. °· Move your fingers often to avoid stiffness and to lessen swelling. °· Raise the injured area above the level of your heart while you are sitting or lying  down. °Driving °· Do not drive or operate heavy machinery while taking pain medicine. °· Do not drive while wearing a cast or splint on a hand that you use for driving. °Activity °· Return to your normal activities as directed by your health care provider. Ask your health care provider what activities are safe for you. °General Instructions °· Do not put pressure on any part of the cast or splint until it is fully hardened. This may take several hours. °· Keep the cast or splint clean and dry. °· Do not use any tobacco products, including cigarettes, chewing tobacco, or electronic cigarettes. Tobacco can delay bone healing. If you need help quitting, ask your health care provider. °· Take medicines only as directed by your health care provider. °· Keep all follow-up visits as directed by your health care provider. This is important. °SEEK MEDICAL CARE IF:  °· Your pain is getting worse. °· You have redness, swelling, or pain in the injured area.   °· You have fluid, blood, or pus coming from under your cast or splint.   °· You notice a bad smell coming from under your cast or splint.   °· You have a fever.   °SEEK IMMEDIATE MEDICAL CARE IF:  °· You develop a rash.   °· You have trouble breathing.   °· Your skin or nails on your injured hand turn blue or gray even after you loosen your splint. °· Your injured hand feels cold or becomes numb even after you loosen your splint.   °· You develop severe pain under the cast or in your hand. °  °This information is not intended to replace advice given to you by your health care provider. Make sure you discuss any questions you have with your health care provider. °  °Document Released: 01/04/2005 Document Revised: 09/25/2014 Document Reviewed: 10/24/2013 °Elsevier Interactive Patient Education ©2016 [employer] ° °

## 2015-04-15 NOTE — ED Notes (Signed)
Ortho tech applying ulnar-gutter splint.

## 2015-04-15 NOTE — ED Notes (Signed)
Ortho tech paged  

## 2015-04-15 NOTE — Progress Notes (Signed)
Orthopedic Tech Progress Note Patient Details:  Samul DadaDorrian D Wingate-Hayes [birth year] [id number]  Ortho Devices Type of Ortho Device: Ace wrap, Arm sling, Ulna gutter splint Ortho Device/Splint Location: rue Ortho Device/Splint Interventions: Application   Mayara Paulson 04/15/2015, 12:06 PM

## 2015-04-26 ENCOUNTER — Emergency Department (HOSPITAL_COMMUNITY): Payer: Medicaid Other

## 2015-04-26 ENCOUNTER — Encounter (HOSPITAL_COMMUNITY): Payer: Self-pay | Admitting: Emergency Medicine

## 2015-04-26 ENCOUNTER — Emergency Department (HOSPITAL_COMMUNITY)
Admission: EM | Admit: 2015-04-26 | Discharge: 2015-04-26 | Disposition: A | Discharge status: Home / Self Care | Payer: Medicaid Other | Attending: Emergency Medicine | Admitting: Emergency Medicine

## 2015-04-26 DIAGNOSIS — S63266A Dislocation of metacarpophalangeal joint of right little finger, initial encounter: Secondary | ICD-10-CM | POA: Insufficient documentation

## 2015-04-26 DIAGNOSIS — S6991XA Unspecified injury of right wrist, hand and finger(s), initial encounter: Secondary | ICD-10-CM | POA: Diagnosis present

## 2015-04-26 DIAGNOSIS — S62314A Displaced fracture of base of fourth metacarpal bone, right hand, initial encounter for closed fracture: Secondary | ICD-10-CM | POA: Insufficient documentation

## 2015-04-26 DIAGNOSIS — Y9289 Other specified places as the place of occurrence of the external cause: Secondary | ICD-10-CM | POA: Insufficient documentation

## 2015-04-26 DIAGNOSIS — S6291XA Unspecified fracture of right wrist and hand, initial encounter for closed fracture: Secondary | ICD-10-CM

## 2015-04-26 DIAGNOSIS — Y998 Other external cause status: Secondary | ICD-10-CM | POA: Insufficient documentation

## 2015-04-26 DIAGNOSIS — W228XXA Striking against or struck by other objects, initial encounter: Secondary | ICD-10-CM | POA: Insufficient documentation

## 2015-04-26 DIAGNOSIS — Y9389 Activity, other specified: Secondary | ICD-10-CM | POA: Insufficient documentation

## 2015-04-26 MED ORDER — DICLOFENAC SODIUM 50 MG PO TBEC: 50.0000 mg | DELAYED_RELEASE_TABLET | Freq: Two times a day (BID) | ORAL | Status: DC

## 2015-04-26 NOTE — ED Notes (Signed)
Pt reminded to NOT remove the cast. Advised to use a double wrap of plastic to protect cast when showering. Pt strongly encouraged to follow up with hand specialist to prevent the possibility of permanent damage to r/hand

## 2015-04-26 NOTE — ED Notes (Signed)
Pt reports that he broke his r/hand in 3 places 2 weeks ago. Stated that he attempted fiollow up with ortho as recommended by EDP 2 weeks ago. Stated that [facility] [place] [facility].

## 2015-04-26 NOTE — ED Provider Notes (Signed)
CSN: [id number]     Arrival date & time 04/26/15  1245 History  By signing my name below, I, Soijett Blue, attest that this documentation has been prepared under the direction and in the presence of Langston Masker, PA-C Electronically Signed: Soijett Blue, ED Scribe. 04/26/2015. 1:42 PM.   Chief Complaint  Patient presents with  . Hand Pain     The history is provided by the patient. No language interpreter was used.    Richard Ortiz is a 20 y.o. male who presents to the Emergency Department complaining of right hand pain onset 2 weeks. Pt notes that he broke his right hand due to punching a wall. Pt notes that he was seen at [facility] ED and given pain medications for the relief of his symptoms. Pt states that he attempted to follow up with orthopedist as recommended with no such luck. Pt reports that he removed his cast himself today due to pain. Pt is having associated symptoms of right hand swelling. He notes that he has not tried any medications for the relief of his symptoms. He denies color change, wound, rash, and any other symptoms.    History reviewed. No pertinent past medical history. History reviewed. No pertinent past surgical history. No family history on file. Social History  Substance Use Topics  . Smoking status: Never Smoker   . Smokeless tobacco: None  . Alcohol Use: Yes    Review of Systems  Musculoskeletal: Positive for joint swelling and arthralgias.  Skin: Negative for color change, rash and wound.  All other systems reviewed and are negative.     Allergies  Review of patient's allergies indicates no known allergies.  Home Medications   Prior to Admission medications   Not on File   BP 151/78 mmHg  Pulse 62  Temp(Src) 98.3 F (36.8 C) (Oral)  Resp 18  SpO2 95% Physical Exam  Constitutional: He is oriented to person, place, and time. He appears well-developed and well-nourished. No distress.  HENT:  Head: Normocephalic and atraumatic.  Eyes: EOM  are normal.  Neck: Neck supple.  Cardiovascular: Normal rate.   Pulmonary/Chest: Effort normal. No respiratory distress.  Musculoskeletal: Normal range of motion.  Right hand: Abrasions to right hand. Swelling over 4th and 5th metacarpals. Slight decreased ROM 4th and 5th finger. NVI and neurosensory intact.   Neurological: He is alert and oriented to person, place, and time.  Skin: Skin is warm and dry.  Psychiatric: He has a normal mood and affect. His behavior is normal.  Nursing note and vitals reviewed.   ED Course  Procedures (including critical care time) DIAGNOSTIC STUDIES: Oxygen Saturation is 95% on RA, adequate by my interpretation.    COORDINATION OF CARE: 1:42 PM Discussed treatment plan with pt at bedside which includes right hand xray and pt agreed to plan.    Labs Review Labs Reviewed - No data to display  Imaging Review Dg Hand Complete Right  04/26/2015  CLINICAL DATA:  Pt states he punched a wall with right hand x 2 weeks ago. Pain and swelling to posterior surface of right hand. Abrasions also noted to 2nd-5th MCP joints. EXAM: RIGHT HAND - COMPLETE 3+ VIEW COMPARISON:  None. FINDINGS: There is a a mildly comminuted fracture of the dorsal, ulnar margin of the hamate. The base of the fifth metacarpal is dislocated posteriorly along with the fracture fragments. There is also an oblique fracture across the radial volar base of the fourth metacarpal, which is also dislocated posteriorly in  relation to the hamate. No other fractures.  Remaining joints are normally aligned. IMPRESSION: 1. Displaced fractures of the hamate and base of the fourth metacarpal. The fourth and fifth metacarpal bases have dislocated posteriorly in relation to the hamate. Electronically Signed   By: Amie Portlandavid  Ormond M.D.   On: 04/26/2015 14:14   I have personally reviewed and evaluated these images as part of my medical decision-making.   EKG Interpretation None      MDM  Pt counseled on need  for evaluation by Orthopaedist.  Pt advised to call the Hand surgeon on Monday to be seen and evaluated.   Final diagnoses:  Fractured hand, right, closed, initial encounter    An After Visit Summary was printed and given to the patient. Meds ordered this encounter  Medications  . diclofenac (VOLTAREN) 50 MG EC tablet    Sig: Take 1 tablet (50 mg total) by mouth 2 (two) times daily.    Dispense:  14 tablet    Refill:  0    Order Specific Question:  Supervising Provider    Answer:  Eber HongMILLER, BRIAN [[id number]]    Lonia SkinnerLeslie K PilgrimSofia, PA-C 04/26/15 1429  Laurence Spatesachel Morgan Little, MD 04/27/15 [phone]

## 2015-04-26 NOTE — Discharge Instructions (Signed)
Cast or Splint Care °Casts and splints support injured limbs and keep bones from moving while they heal. It is important to care for your cast or splint at home.   °HOME CARE INSTRUCTIONS °· Keep the cast or splint uncovered during the drying period. It can take 24 to 48 hours to dry if it is made of plaster. A fiberglass cast will dry in less than 1 hour. °· Do not rest the cast on anything harder than a pillow for the first 24 hours. °· Do not put weight on your injured limb or apply pressure to the cast until your health care provider gives you permission. °· Keep the cast or splint dry. Wet casts or splints can lose their shape and may not support the limb as well. A wet cast that has lost its shape can also create harmful pressure on your skin when it dries. Also, wet skin can become infected. °· Cover the cast or splint with a plastic bag when bathing or when out in the rain or snow. If the cast is on the trunk of the body, take sponge baths until the cast is removed. °· If your cast does become wet, dry it with a towel or a blow dryer on the cool setting only. °· Keep your cast or splint clean. Soiled casts may be wiped with a moistened cloth. °· Do not place any hard or soft foreign objects under your cast or splint, such as cotton, toilet paper, lotion, or powder. °· Do not try to scratch the skin under the cast with any object. The object could get stuck inside the cast. Also, scratching could lead to an infection. If itching is a problem, use a blow dryer on a cool setting to relieve discomfort. °· Do not trim or cut your cast or remove padding from inside of it. °· Exercise all joints next to the injury that are not immobilized by the cast or splint. For example, if you have a long leg cast, exercise the hip joint and toes. If you have an arm cast or splint, exercise the shoulder, elbow, thumb, and fingers. °· Elevate your injured arm or leg on 1 or 2 pillows for the first 1 to 3 days to decrease  swelling and pain. It is best if you can comfortably elevate your cast so it is higher than your heart. °SEEK MEDICAL CARE IF:  °· Your cast or splint cracks. °· Your cast or splint is too tight or too loose. °· You have unbearable itching inside the cast. °· Your cast becomes wet or develops a soft spot or area. °· You have a bad smell coming from inside your cast. °· You get an object stuck under your cast. °· Your skin around the cast becomes red or raw. °· You have new pain or worsening pain after the cast has been applied. °SEEK IMMEDIATE MEDICAL CARE IF:  °· You have fluid leaking through the cast. °· You are unable to move your fingers or toes. °· You have discolored (blue or white), cool, painful, or very swollen fingers or toes beyond the cast. °· You have tingling or numbness around the injured area. °· You have severe pain or pressure under the cast. °· You have any difficulty with your breathing or have shortness of breath. °· You have chest pain. °  °This information is not intended to replace advice given to you by your health care provider. Make sure you discuss any questions you have with your health care   provider. °  °Document Released: 01/02/2000 Document Revised: 10/25/2012 Document Reviewed: 07/13/2012 °Elsevier Interactive Patient Education ©2016 [employer] ° °Metacarpal Fracture °A metacarpal fracture is a break (fracture) of a bone in the hand. Metacarpals are the bones that extend from your knuckles to your wrist. In each hand, you have five metacarpal bones that connect your fingers and your thumb to your wrist. °Some hand fractures have bone pieces that are close together and stable (simple). These fractures may be treated with only a splint or cast. Hand fractures that have many pieces of broken bone (comminuted), unstable bone pieces (displaced), or a bone that breaks through the skin (compound) usually require surgery. °CAUSES °This injury may be caused by: °· A fall. °· A hard,  direct hit to your hand. °· An injury that squeezes your knuckle, stretches your finger out of place, or crushes your hand. °RISK FACTORS °This injury is more likely to occur if: °· You play contact sports. °· You have certain bone diseases. °SYMPTOMS  °Symptoms of this type of fracture develop soon after the injury. Symptoms may include: °· Swelling. °· Pain. °· Stiffness. °· Increased pain with movement. °· Bruising. °· Inability to move a finger. °· A shortened finger. °· A finger knuckle that looks sunken in. °· Unusual appearance of the hand or finger (deformity). °DIAGNOSIS  °This injury may be diagnosed based on your signs and symptoms, especially if you had a recent hand injury. Your health care provider will perform a physical exam. He or she may also order X-rays to confirm the diagnosis.  °TREATMENT  °Treatment for this injury depends on the type of fracture you have and how severe it is. Possible treatments include: °· Non-reduction. This can be done if the bone does not need to be moved back into place. The fracture can be casted or splinted as it is.   °· Closed reduction. If your bone is stable and can be moved back into place, you may only need to wear a cast or splint or have buddy taping. °· Closed reduction with internal fixation (CRIF). This is the most common treatment. You may have this procedure if your bone can be moved back into place but needs more support. Wires, pins, or screws may be inserted through your skin to stabilize the fracture. °· Open reduction with internal fixation (ORIF). This may be needed if your fracture is severe and unstable. It involves surgery to move your bone back into the right position. Screws, wires, or plates are used to stabilize the fracture. °After all procedures, you may need to wear a cast or a splint for several weeks. You will also need to have follow-up X-rays to make sure that the bone is healing well and staying in position. After you no longer need  your cast or splint, you may need physical therapy. This will help you to regain full movement and strength in your hand.  °HOME CARE INSTRUCTIONS  °If You Have a Cast: °· Do not stick anything inside the cast to scratch your skin. Doing that increases your risk of infection. °· Check the skin around the cast every day. Report any concerns to your health care provider. You may put lotion on dry skin around the edges of the cast. Do not apply lotion to the skin underneath the cast. °If You Have a Splint: °· Wear it as directed by your health care provider. Remove it only as directed by your health care provider. °· Loosen the splint if your   fingers become numb and tingle, or if they turn cold and blue. °Bathing °· Cover the cast or splint with a watertight plastic bag to protect it from water while you take a bath or a shower. Do not let the cast or splint get wet. °Managing Pain, Stiffness, and Swelling °· If directed, apply ice to the injured area (if you have a splint, not a cast): °¨ Put ice in a plastic bag. °¨ Place a towel between your skin and the bag. °¨ Leave the ice on for 20 minutes, 2-3 times a day. °· Move your fingers often to avoid stiffness and to lessen swelling. °· Raise the injured area above the level of your heart while you are sitting or lying down. °Driving °· Do not drive or operate heavy machinery while taking pain medicine. °· Do not drive while wearing a cast or splint on a hand that you use for driving. °Activity °· Return to your normal activities as directed by your health care provider. Ask your health care provider what activities are safe for you. °General Instructions °· Do not put pressure on any part of the cast or splint until it is fully hardened. This may take several hours. °· Keep the cast or splint clean and dry. °· Do not use any tobacco products, including cigarettes, chewing tobacco, or electronic cigarettes. Tobacco can delay bone healing. If you need help quitting, ask  your health care provider. °· Take medicines only as directed by your health care provider. °· Keep all follow-up visits as directed by your health care provider. This is important. °SEEK MEDICAL CARE IF:  °· Your pain is getting worse. °· You have redness, swelling, or pain in the injured area.   °· You have fluid, blood, or pus coming from under your cast or splint.   °· You notice a bad smell coming from under your cast or splint.   °· You have a fever.   °SEEK IMMEDIATE MEDICAL CARE IF:  °· You develop a rash.   °· You have trouble breathing.   °· Your skin or nails on your injured hand turn blue or gray even after you loosen your splint. °· Your injured hand feels cold or becomes numb even after you loosen your splint.   °· You develop severe pain under the cast or in your hand. °  °This information is not intended to replace advice given to you by your health care provider. Make sure you discuss any questions you have with your health care provider. °  °Document Released: 01/04/2005 Document Revised: 09/25/2014 Document Reviewed: 10/24/2013 °Elsevier Interactive Patient Education ©2016 Elsevier Inc. ° °

## 2015-04-26 NOTE — ED Notes (Signed)
Pt complaint of continued hand pain post break; removed cast himself today.

## 2015-04-28 ENCOUNTER — Encounter (HOSPITAL_COMMUNITY): Payer: Self-pay | Admitting: Emergency Medicine

## 2015-05-29 ENCOUNTER — Emergency Department (HOSPITAL_COMMUNITY): Payer: Medicaid Other

## 2015-05-29 ENCOUNTER — Emergency Department (HOSPITAL_COMMUNITY)
Admission: EM | Admit: 2015-05-29 | Discharge: 2015-05-29 | Disposition: A | Discharge status: Home / Self Care | Payer: Medicaid Other | Attending: Emergency Medicine | Admitting: Emergency Medicine

## 2015-05-29 DIAGNOSIS — J45909 Unspecified asthma, uncomplicated: Secondary | ICD-10-CM | POA: Diagnosis not present

## 2015-05-29 DIAGNOSIS — S62314P Displaced fracture of base of fourth metacarpal bone, right hand, subsequent encounter for fracture with malunion: Secondary | ICD-10-CM | POA: Diagnosis not present

## 2015-05-29 DIAGNOSIS — Z792 Long term (current) use of antibiotics: Secondary | ICD-10-CM | POA: Diagnosis not present

## 2015-05-29 DIAGNOSIS — Z09 Encounter for follow-up examination after completed treatment for conditions other than malignant neoplasm: Secondary | ICD-10-CM | POA: Diagnosis present

## 2015-05-29 DIAGNOSIS — S62316P Displaced fracture of base of fifth metacarpal bone, right hand, subsequent encounter for fracture with malunion: Secondary | ICD-10-CM | POA: Insufficient documentation

## 2015-05-29 DIAGNOSIS — Z791 Long term (current) use of non-steroidal anti-inflammatories (NSAID): Secondary | ICD-10-CM | POA: Diagnosis not present

## 2015-05-29 DIAGNOSIS — W228XXD Striking against or struck by other objects, subsequent encounter: Secondary | ICD-10-CM | POA: Insufficient documentation

## 2015-05-29 DIAGNOSIS — Z8719 Personal history of other diseases of the digestive system: Secondary | ICD-10-CM | POA: Diagnosis not present

## 2015-05-29 DIAGNOSIS — S6291XP Unspecified fracture of right wrist and hand, subsequent encounter for fracture with malunion: Secondary | ICD-10-CM

## 2015-05-29 MED ORDER — DICLOFENAC SODIUM 50 MG PO TBEC: 50.0000 mg | DELAYED_RELEASE_TABLET | Freq: Two times a day (BID) | ORAL | Status: DC

## 2015-05-29 MED ORDER — IBUPROFEN 400 MG PO TABS
800.0000 mg | ORAL_TABLET | Freq: Once | ORAL | Status: AC
  Administered 2015-05-29: 800 mg via ORAL
  Filled 2015-05-29: qty 2

## 2015-05-29 NOTE — ED Notes (Signed)
Pt reports being seen here for fracture of right hand approx 1 mo ago. Splint intact. Referred to ortho. Pt states no money for surgery. Pt states hand feels better. Denies pain. NAD at present.

## 2015-05-29 NOTE — ED Notes (Signed)
Called ortho to come place ulnar gutter on patient.

## 2015-05-29 NOTE — ED Notes (Signed)
Mother came out to desk to ask for a basin.  I advised that once I got off the phone I would come into the room.   Mother then went to another desk in POD [facility] to ask provider for the basin.  I again stated I would get it, I had just got off the interpreter phone.   I took a basin to patient's room and gave to the patient.   Mother had went from POD F to POD A to ask the other nurses station for a basin.  I had already taken basin to patient room.

## 2015-05-29 NOTE — ED Provider Notes (Signed)
CSN: [id number]     Arrival date & time 05/29/15  1141 History  By signing my name below, I, Emmanuella Mensah, attest that this documentation has been prepared under the direction and in the presence of Fayrene HelperBowie Mareta Chesnut, PA-C. Electronically Signed: Angelene GiovanniEmmanuella Mensah, ED Scribe. 05/29/2015. 1:10 PM.     Chief Complaint  Patient presents with  . Follow-up   The history is provided by the patient. No language interpreter was used.   HPI Comments: Richard Ortiz is a 20 y.o. male who presents with a right hand splint to the Emergency Department requesting evaluation for his right hand fracture on 04/26/15 where he was told he would require surgery and given Ortho follow up instruction. Pt reports that he had a fracture of his right hand s/p punching a wall one month ago. He denies any current pain. He explains that he has not been able to follow up with Ortho per his referral from his last visit here because they needed authorization from his PCP. Pt adds that he believes this is a misunderstanding as he does not have a PCP and is confused on his next steps to proceed with Ortho and possible surgery. He lost his follow up paperwork after his first visit and was unable to follow up until one week. No fever, rash, or any open wounds. No other complaints at this time. This is his fourth visits for the same complaint.  Past Medical History  Diagnosis Date  . Asthma   . Abdominal hernia    No past surgical history on file. Family History  Problem Relation Age of Onset  . Hypertension Maternal Grandmother    Social History  Substance Use Topics  . Smoking status: Never Smoker   . Smokeless tobacco: Not on file  . Alcohol Use: Yes    Review of Systems  Constitutional: Negative for fever.  Skin: Negative for rash and wound.      Allergies  Review of patient's allergies indicates no known allergies.  Home Medications   Prior to Admission medications   Medication Sig Start Date End  Date Taking? Authorizing Provider  diclofenac (VOLTAREN) 50 MG EC tablet Take 1 tablet (50 mg total) by mouth 2 (two) times daily. 04/15/15   Elson AreasLeslie K Sofia, PA-C  diclofenac (VOLTAREN) 50 MG EC tablet Take 1 tablet (50 mg total) by mouth 2 (two) times daily. 04/26/15   Elson AreasLeslie K Sofia, PA-C  doxycycline (VIBRA-TABS) 100 MG tablet Take 1 tablet (100 mg total) by mouth 2 (two) times daily. 02/12/14   Arthor CaptainAbigail Harris, PA-C  HYDROcodone-acetaminophen (NORCO/VICODIN) 5-325 MG tablet Take 1-2 tablets by mouth every 6 (six) hours as needed. 04/15/15   Elson AreasLeslie K Sofia, PA-C  ibuprofen (ADVIL,MOTRIN) 800 MG tablet Take 1 tablet (800 mg total) by mouth 3 (three) times daily. 02/10/15   Melton KrebsSamantha Nicole Riley, PA-C  methocarbamol (ROBAXIN) 500 MG tablet Take 1 tablet (500 mg total) by mouth 2 (two) times daily. 02/10/15   Melton KrebsSamantha Nicole Riley, PA-C  OVER THE COUNTER MEDICATION Take 1 tablet by mouth as needed (for constipation). "Laxative"    Historical Provider, MD  polyethylene glycol (MIRALAX / GLYCOLAX) packet Take 17 g by mouth daily as needed for mild constipation or moderate constipation. 07/26/13   Hannah Muthersbaugh, PA-C  promethazine (PHENERGAN) 25 MG tablet Take 1 tablet (25 mg total) by mouth every 6 (six) hours as needed for nausea or vomiting. 07/26/13   Hannah Muthersbaugh, PA-C   BP 127/73 mmHg  Pulse 85  Temp(Src) 97.9 F (36.6 C) (Oral)  Resp 18  Ht  (1.575 m)  Wt 140 lb (63.504 kg)  BMI 25.60 kg/m2  SpO2 100% Physical Exam  Constitutional: He is oriented to person, place, and time. He appears well-developed and well-nourished.  HENT:  Head: Normocephalic and atraumatic.  Cardiovascular: Normal rate and intact distal pulses.   Pulmonary/Chest: Effort normal.  Musculoskeletal: He exhibits tenderness.  Closed displaced fracture at the base of the 3rd and 4th proximal metacarpal  Right hand: obvious closed deformity at the 4th and 5th metacarpal region that is TTP. Pt unable to make a fist.  Brisk cap refills to all fingers. Radial pulses 2+  Neurological: He is alert and oriented to person, place, and time.  Sensation of R hand distal to the injury is intact.  Skin: Skin is warm and dry.  Psychiatric: He has a normal mood and affect.  Nursing note and vitals reviewed.   ED Course  Procedures (including critical care time) DIAGNOSTIC STUDIES: Oxygen Saturation is 100% on RA, normal by my interpretation.    COORDINATION OF CARE: 12:52 PM- Pt advised of plan for treatment and pt agrees. Explained that pt's hand is not healing well and needs to follow up with Ortho for appropriate care. Pt will receive another right hand splint. He will also receive another referral for follow up with Ortho for surgery.    Imaging Review Dg Hand Complete Right  05/29/2015  CLINICAL DATA:  Check for healing of a hand fracture. Patient wants new cast. EXAM: RIGHT HAND - COMPLETE 3+ VIEW COMPARISON:  04/14/2015 FINDINGS: Fourth and fifth metacarpal base fractures with posterior displacement/ dislocation. There is no visible bridging callus. Alignment is similar to prior. No new osseous abnormality is seen. Bony detail is diminished by overlapping cast. IMPRESSION: Unchanged fourth and fifth metacarpal base fractures with dorsal displacement/ dislocation. No bridging callus. Electronically Signed   By: Marnee Spring M.D.   On: 05/29/2015 12:22     Fayrene Helper, PA-C has personally reviewed and evaluated these images as part of his medical decision-making.  MDM   Final diagnoses:  Closed hand fracture, right, with malunion, subsequent encounter    BP 127/73 mmHg  Pulse 85  Temp(Src) 97.9 F (36.6 C) (Oral)  Resp 18  Ht  (1.575 m)  Wt 63.504 kg  BMI 25.60 kg/m2  SpO2 100%   I personally performed the services described in this documentation, which was scribed in my presence. The recorded information has been reviewed and is accurate.     Fayrene Helper, PA-C 05/29/15 1331  Gwyneth Sprout, MD 05/29/15 2232

## 2015-05-29 NOTE — Discharge Instructions (Signed)
You have a broken hand that will likely need surgical repair.  Please call and follow up closely with hand specialist for further management.

## 2016-07-29 ENCOUNTER — Emergency Department (HOSPITAL_COMMUNITY)
Admission: EM | Admit: 2016-07-29 | Discharge: 2016-07-29 | Disposition: A | Discharge status: Home / Self Care | Payer: Medicaid Other | Attending: Emergency Medicine | Admitting: Emergency Medicine

## 2016-07-29 ENCOUNTER — Encounter (HOSPITAL_COMMUNITY): Payer: Self-pay | Admitting: Emergency Medicine

## 2016-07-29 DIAGNOSIS — K529 Noninfective gastroenteritis and colitis, unspecified: Secondary | ICD-10-CM | POA: Insufficient documentation

## 2016-07-29 DIAGNOSIS — J45909 Unspecified asthma, uncomplicated: Secondary | ICD-10-CM | POA: Insufficient documentation

## 2016-07-29 LAB — COMPREHENSIVE METABOLIC PANEL
ALBUMIN: 4 g/dL (ref 3.5–5.0)
ALT: 26 U/L (ref 17–63)
ANION GAP: 13 (ref 5–15)
AST: 28 U/L (ref 15–41)
Alkaline Phosphatase: 83 U/L (ref 38–126)
BUN: 10 mg/dL (ref 6–20)
CHLORIDE: 99 mmol/L — AB (ref 101–111)
CO2: 24 mmol/L (ref 22–32)
Calcium: 9.7 mg/dL (ref 8.9–10.3)
Creatinine, Ser: 1.14 mg/dL (ref 0.61–1.24)
GFR calc Af Amer: >60 mL/min (ref >60)
GFR calc non Af Amer: >60 mL/min (ref >60)
GLUCOSE: 121 mg/dL — AB (ref 65–99)
Potassium: 4 mmol/L (ref 3.5–5.1)
SODIUM: 136 mmol/L (ref 135–145)
Total Bilirubin: 0.6 mg/dL (ref 0.3–1.2)
Total Protein: 8.4 g/dL — ABNORMAL HIGH (ref 6.5–8.1)

## 2016-07-29 LAB — CBC
HEMATOCRIT: 46.3 % (ref 39.0–52.0)
HEMOGLOBIN: 15 g/dL (ref 13.0–17.0)
MCH: 28.4 pg (ref 26.0–34.0)
MCHC: 32.4 g/dL (ref 30.0–36.0)
MCV: 87.7 fL (ref 78.0–100.0)
Platelets: 333 10*3/uL (ref 150–400)
RBC: 5.28 MIL/uL (ref 4.22–5.81)
RDW: 12.8 % (ref 11.5–15.5)
WBC: 6 10*3/uL (ref 4.0–10.5)

## 2016-07-29 LAB — URINALYSIS, ROUTINE W REFLEX MICROSCOPIC
BACTERIA UA: NONE SEEN
BILIRUBIN URINE: NEGATIVE
Glucose, UA: NEGATIVE mg/dL
HGB URINE DIPSTICK: NEGATIVE
Ketones, ur: 20 mg/dL — AB
Nitrite: NEGATIVE
PROTEIN: 100 mg/dL — AB
SPECIFIC GRAVITY, URINE: 1.026 (ref 1.005–1.030)
SQUAMOUS EPITHELIAL / LPF: NONE SEEN
pH: 8 (ref 5.0–8.0)

## 2016-07-29 LAB — LIPASE, BLOOD: LIPASE: 22 U/L (ref 11–51)

## 2016-07-29 MED ORDER — PROMETHAZINE HCL 25 MG/ML IJ SOLN
12.5000 mg | Freq: Once | INTRAMUSCULAR | Status: AC
  Administered 2016-07-29: 12.5 mg via INTRAVENOUS
  Filled 2016-07-29: qty 1

## 2016-07-29 MED ORDER — ONDANSETRON 4 MG PO TBDP
4.0000 mg | ORAL_TABLET | Freq: Once | ORAL | Status: AC | PRN
  Administered 2016-07-29: 4 mg via ORAL

## 2016-07-29 MED ORDER — ONDANSETRON 4 MG PO TBDP
ORAL_TABLET | ORAL | Status: AC
  Filled 2016-07-29: qty 1

## 2016-07-29 MED ORDER — SODIUM CHLORIDE 0.9 % IV BOLUS (SEPSIS)
1000.0000 mL | Freq: Once | INTRAVENOUS | Status: AC
  Administered 2016-07-29: 1000 mL via INTRAVENOUS

## 2016-07-29 MED ORDER — PROMETHAZINE HCL 25 MG PO TABS: 25.0000 mg | ORAL_TABLET | Freq: Four times a day (QID) | ORAL | 0 refills | Status: DC | PRN

## 2016-07-29 NOTE — ED Triage Notes (Signed)
Pt sts N/V/D and body aches x 2 days

## 2016-07-29 NOTE — Discharge Instructions (Signed)
Please read attached information. If you experience any new or worsening signs or symptoms please return to the emergency room for evaluation. Please follow-up with your primary care provider or specialist as discussed. Please use medication prescribed only as directed and discontinue taking if you have any concerning signs or symptoms.   °

## 2016-07-29 NOTE — ED Provider Notes (Signed)
[facility]-EMERGENCY DEPT Provider Note   CSN: [id number] Arrival date & time: 07/29/16  1013  By signing my name below, I, Rosario AdieWilliam Andrew Hiatt, attest that this documentation has been prepared under the direction and in the presence of Newell RubbermaidJeffrey Sheamus Hasting, PA-C.  Electronically Signed: Rosario AdieWilliam Andrew Hiatt, ED Scribe. 07/29/16. 12:44 PM.  History   Chief Complaint Chief Complaint  Patient presents with  . Emesis  . Generalized Body Aches   The history is provided by the patient. No language interpreter was used.    HPI Comments: Richard Ortiz is a 21 y.o. male with a h/o asthma and abdominal hernia, who presents to the Emergency Department complaining of persistent generalized myalgias beginning two days ago. He reports associated cough, rhinorrhea, congestion, nausea, vomiting, and diarrhea beginning yesterday. Additionally, he reports that he has a small hernia to the right-sided inguinal region which was in increased pain when his symptoms initially began. This was self-reduced yesterday and he states that his pain to the area has improved. Pt notes that his symptoms are worse w/ PO intake. He has been attempting to increase his fluid intake at home w/o relief as well. He was given Zofran while in the waiting room without relief of his symptoms. He reports that his symptoms are worse at night. No sick contacts with similar symptoms. No recent suspicious food or fluid intake. He denies fever, bloody stools, or any other associated symptoms.   Past Medical History:  Diagnosis Date  . Abdominal hernia   . Asthma    Patient Active Problem List   Diagnosis Date Noted  . SIRS (systemic inflammatory response syndrome) (HCC) 03/25/2012  . Facial cellulitis 03/25/2012  . Dental abscess 03/25/2012   History reviewed. No pertinent surgical history.  Home Medications    Prior to Admission medications   Medication Sig Start Date End Date Taking? Authorizing Provider  diclofenac (VOLTAREN)  50 MG EC tablet Take 1 tablet (50 mg total) by mouth 2 (two) times daily. 05/29/15   Fayrene Helperran, Bowie, PA-C  doxycycline (VIBRA-TABS) 100 MG tablet Take 1 tablet (100 mg total) by mouth 2 (two) times daily. 02/12/14   Arthor CaptainHarris, Abigail, PA-C  HYDROcodone-acetaminophen (NORCO/VICODIN) 5-325 MG tablet Take 1-2 tablets by mouth every 6 (six) hours as needed. 04/15/15   Elson AreasSofia, Leslie K, PA-C  ibuprofen (ADVIL,MOTRIN) 800 MG tablet Take 1 tablet (800 mg total) by mouth 3 (three) times daily. 02/10/15   Melton Krebsiley, Samantha Nicole, PA-C  methocarbamol (ROBAXIN) 500 MG tablet Take 1 tablet (500 mg total) by mouth 2 (two) times daily. 02/10/15   Melton Krebsiley, Samantha Nicole, PA-C  OVER THE COUNTER MEDICATION Take 1 tablet by mouth as needed (for constipation). "Laxative"    [provider]  polyethylene glycol (MIRALAX / GLYCOLAX) packet Take 17 g by mouth daily as needed for mild constipation or moderate constipation. 07/26/13   Muthersbaugh, Dahlia ClientHannah, PA-C  promethazine (PHENERGAN) 25 MG tablet Take 1 tablet (25 mg total) by mouth every 6 (six) hours as needed for nausea or vomiting. 07/29/16   Eyvonne MechanicHedges, Jasmene Goswami, PA-C   Family History Family History  Problem Relation Age of Onset  . Hypertension Maternal Grandmother    Social History Social History  Substance Use Topics  . Smoking status: Never Smoker  . Smokeless tobacco: Not on file  . Alcohol use Yes   Allergies   Patient has no known allergies.  Review of Systems Review of Systems  Constitutional: Negative for fever.  HENT: Positive for congestion and rhinorrhea.  Respiratory: Positive for cough.   Gastrointestinal: Positive for abdominal distention ( +hernia, reduced), diarrhea, nausea and vomiting. Negative for blood in stool.  Musculoskeletal: Positive for myalgias (generalized).  All other systems reviewed and are negative.  Physical Exam Updated Vital Signs BP 111/67   Pulse 97   Temp 98.6 F (37 C) (Oral)   Resp (!) 24   Ht 5\' 3"  (1.6 m)    Wt 59 kg (130 lb)   SpO2 96%   BMI 23.03 kg/m   Physical Exam  Constitutional: He appears well-developed and well-nourished. No distress.  HENT:  Head: Normocephalic and atraumatic.  Eyes: Conjunctivae are normal.  Neck: Normal range of motion.  Cardiovascular: Normal rate.   Pulmonary/Chest: Effort normal.  Abdominal: Soft. He exhibits no distension. There is no tenderness.  Genitourinary:  Genitourinary Comments: Chaperone present throughout entire exam. Minor TTP of the inguinal region with no masses.   Musculoskeletal: Normal range of motion.  Neurological: He is alert.  Skin: No pallor.  Psychiatric: He has a normal mood and affect. His behavior is normal.  Nursing note and vitals reviewed.  ED Treatments / Results  DIAGNOSTIC STUDIES: Oxygen Saturation is 100% on RA, normal by my interpretation.   COORDINATION OF CARE: 12:44 PM-Discussed next steps with pt. Pt verbalized understanding and is agreeable with the plan.   Labs (all labs ordered are listed, but only abnormal results are displayed) Labs Reviewed  COMPREHENSIVE METABOLIC PANEL - Abnormal; Notable for the following:       Result Value   Chloride 99 (*)    Glucose, Bld 121 (*)    Total Protein 8.4 (*)    All other components within normal limits  URINALYSIS, ROUTINE W REFLEX MICROSCOPIC - Abnormal; Notable for the following:    Ketones, ur 20 (*)    Protein, ur 100 (*)    Leukocytes, UA TRACE (*)    All other components within normal limits  LIPASE, BLOOD  CBC   EKG  EKG Interpretation None      Radiology No results found.  Procedures Procedures   Medications Ordered in ED Medications  ondansetron (ZOFRAN-ODT) disintegrating tablet 4 mg (4 mg Oral Given 07/29/16 1026)  sodium chloride 0.9 % bolus 1,000 mL (0 mLs Intravenous Stopped 07/29/16 1526)  promethazine (PHENERGAN) injection 12.5 mg (12.5 mg Intravenous Given 07/29/16 1252)   Initial Impression / Assessment and Plan / ED Course  I  have reviewed the triage vital signs and the nursing notes.  Pertinent labs & imaging results that were available during my care of the patient were reviewed by me and considered in my medical decision making (see chart for details).     Labs:   Imaging:   Consults:   Therapeutics: Zofran, promethazine  Discharge Meds: Promethazine  Assessment/Plan: Patient's presentation is most consistent with viral gastroenteritis.  Patient has reassuring laboratory analysis, soft nontender abdomen.  No signs of appendicitis or intra-abdominal pathology at this time.  Patient tolerating p.o. after antiemetics.  He will be discharged home with symptomatic care instructions and strict return precautions.  He verbalized understanding and agreement to today's plan had no further questions or concerns the time discharge  Final Clinical Impressions(s) / ED Diagnoses   Final diagnoses:  Gastroenteritis   New Prescriptions Discharge Medication List as of 07/29/2016  3:25 PM     I personally performed the services described in this documentation, which was scribed in my presence. The recorded information has been reviewed and is accurate.  Eyvonne Mechanic, PA-C 07/29/16 2135    Maia Plan, MD 07/30/16 [phone]

## 2016-08-07 ENCOUNTER — Emergency Department (HOSPITAL_COMMUNITY): Payer: Self-pay

## 2016-08-07 ENCOUNTER — Inpatient Hospital Stay (HOSPITAL_COMMUNITY)
Admission: EM | Admit: 2016-08-07 | Discharge: 2016-08-10 | DRG: 373 | Disposition: A | Discharge status: Home / Self Care | Payer: Self-pay | Attending: Surgery | Admitting: Surgery

## 2016-08-07 ENCOUNTER — Inpatient Hospital Stay (HOSPITAL_COMMUNITY): Payer: Self-pay

## 2016-08-07 ENCOUNTER — Encounter (HOSPITAL_COMMUNITY): Payer: Self-pay | Admitting: Emergency Medicine

## 2016-08-07 DIAGNOSIS — R1031 Right lower quadrant pain: Secondary | ICD-10-CM

## 2016-08-07 DIAGNOSIS — K5900 Constipation, unspecified: Secondary | ICD-10-CM | POA: Diagnosis present

## 2016-08-07 DIAGNOSIS — K3532 Acute appendicitis with perforation and localized peritonitis, without abscess: Secondary | ICD-10-CM | POA: Diagnosis present

## 2016-08-07 DIAGNOSIS — K651 Peritoneal abscess: Secondary | ICD-10-CM

## 2016-08-07 DIAGNOSIS — K353 Acute appendicitis with localized peritonitis: Principal | ICD-10-CM | POA: Diagnosis present

## 2016-08-07 LAB — COMPREHENSIVE METABOLIC PANEL
ALBUMIN: 3.1 g/dL — AB (ref 3.5–5.0)
ALK PHOS: 85 U/L (ref 38–126)
ALT: 43 U/L (ref 17–63)
ANION GAP: 10 (ref 5–15)
AST: 38 U/L (ref 15–41)
BUN: 8 mg/dL (ref 6–20)
CALCIUM: 9.1 mg/dL (ref 8.9–10.3)
CO2: 27 mmol/L (ref 22–32)
Chloride: 100 mmol/L — ABNORMAL LOW (ref 101–111)
Creatinine, Ser: 1 mg/dL (ref 0.61–1.24)
GFR calc non Af Amer: >60 mL/min (ref >60)
GLUCOSE: 130 mg/dL — AB (ref 65–99)
POTASSIUM: 3.4 mmol/L — AB (ref 3.5–5.1)
SODIUM: 137 mmol/L (ref 135–145)
Total Bilirubin: 0.9 mg/dL (ref 0.3–1.2)
Total Protein: 8.1 g/dL (ref 6.5–8.1)

## 2016-08-07 LAB — CBC
HEMATOCRIT: 38.4 % — AB (ref 39.0–52.0)
HEMOGLOBIN: 12.9 g/dL — AB (ref 13.0–17.0)
MCH: 29.1 pg (ref 26.0–34.0)
MCHC: 33.6 g/dL (ref 30.0–36.0)
MCV: 86.5 fL (ref 78.0–100.0)
Platelets: 463 10*3/uL — ABNORMAL HIGH (ref 150–400)
RBC: 4.44 MIL/uL (ref 4.22–5.81)
RDW: 12.6 % (ref 11.5–15.5)
WBC: 11.4 10*3/uL — ABNORMAL HIGH (ref 4.0–10.5)

## 2016-08-07 LAB — URINALYSIS, ROUTINE W REFLEX MICROSCOPIC
BACTERIA UA: NONE SEEN
Bilirubin Urine: NEGATIVE
GLUCOSE, UA: NEGATIVE mg/dL
HGB URINE DIPSTICK: NEGATIVE
KETONES UR: NEGATIVE mg/dL
Leukocytes, UA: NEGATIVE
NITRITE: NEGATIVE
PROTEIN: 100 mg/dL — AB
Specific Gravity, Urine: 1.02 (ref 1.005–1.030)
pH: 9 — ABNORMAL HIGH (ref 5.0–8.0)

## 2016-08-07 LAB — PROTIME-INR
INR: 1.22
Prothrombin Time: 15.5 seconds — ABNORMAL HIGH (ref 11.4–15.2)

## 2016-08-07 LAB — LIPASE, BLOOD: LIPASE: 22 U/L (ref 11–51)

## 2016-08-07 MED ORDER — PIPERACILLIN-TAZOBACTAM 3.375 G IVPB 30 MIN
3.3750 g | Freq: Once | INTRAVENOUS | Status: AC
  Administered 2016-08-07: 3.375 g via INTRAVENOUS
  Filled 2016-08-07: qty 50

## 2016-08-07 MED ORDER — OXYCODONE HCL 5 MG PO TABS
5.0000 mg | ORAL_TABLET | ORAL | Status: DC | PRN
  Administered 2016-08-08 – 2016-08-10 (×3): 10 mg via ORAL
  Filled 2016-08-07 (×5): qty 2

## 2016-08-07 MED ORDER — ONDANSETRON HCL 4 MG/2ML IJ SOLN
4.0000 mg | Freq: Once | INTRAMUSCULAR | Status: AC
  Administered 2016-08-07: 4 mg via INTRAVENOUS
  Filled 2016-08-07: qty 2

## 2016-08-07 MED ORDER — FENTANYL CITRATE (PF) 100 MCG/2ML IJ SOLN
INTRAMUSCULAR | Status: AC
  Filled 2016-08-07: qty 4

## 2016-08-07 MED ORDER — MIDAZOLAM HCL 2 MG/2ML IJ SOLN
INTRAMUSCULAR | Status: AC | PRN
  Administered 2016-08-07 (×6): 1 mg via INTRAVENOUS

## 2016-08-07 MED ORDER — ONDANSETRON 4 MG PO TBDP
4.0000 mg | ORAL_TABLET | Freq: Four times a day (QID) | ORAL | Status: DC | PRN
  Filled 2016-08-07: qty 1

## 2016-08-07 MED ORDER — MORPHINE SULFATE (PF) 4 MG/ML IV SOLN
4.0000 mg | Freq: Once | INTRAVENOUS | Status: AC
  Administered 2016-08-07: 4 mg via INTRAVENOUS
  Filled 2016-08-07: qty 1

## 2016-08-07 MED ORDER — ACETAMINOPHEN 325 MG PO TABS
650.0000 mg | ORAL_TABLET | Freq: Four times a day (QID) | ORAL | Status: DC | PRN
  Filled 2016-08-07: qty 2

## 2016-08-07 MED ORDER — IOPAMIDOL (ISOVUE-300) INJECTION 61%
INTRAVENOUS | Status: AC
  Administered 2016-08-07: 100 mL
  Filled 2016-08-07: qty 100

## 2016-08-07 MED ORDER — MORPHINE SULFATE (PF) 4 MG/ML IV SOLN
2.0000 mg | INTRAVENOUS | Status: DC | PRN
  Administered 2016-08-07 – 2016-08-08 (×8): 2 mg via INTRAVENOUS
  Filled 2016-08-07 (×9): qty 1

## 2016-08-07 MED ORDER — ENOXAPARIN SODIUM 40 MG/0.4ML ~~LOC~~ SOLN
40.0000 mg | SUBCUTANEOUS | Status: DC
  Administered 2016-08-08 – 2016-08-10 (×3): 40 mg via SUBCUTANEOUS
  Filled 2016-08-07 (×3): qty 0.4

## 2016-08-07 MED ORDER — SODIUM CHLORIDE 0.9% FLUSH
5.0000 mL | Freq: Three times a day (TID) | INTRAVENOUS | Status: DC
  Administered 2016-08-07 – 2016-08-10 (×8): 5 mL via INTRAVENOUS

## 2016-08-07 MED ORDER — MIDAZOLAM HCL 2 MG/2ML IJ SOLN
INTRAMUSCULAR | Status: AC
  Filled 2016-08-07: qty 6

## 2016-08-07 MED ORDER — DIPHENHYDRAMINE HCL 25 MG PO CAPS
25.0000 mg | ORAL_CAPSULE | Freq: Four times a day (QID) | ORAL | Status: DC | PRN
  Administered 2016-08-09 – 2016-08-10 (×2): 25 mg via ORAL
  Filled 2016-08-07 (×2): qty 1

## 2016-08-07 MED ORDER — DIPHENHYDRAMINE HCL 50 MG/ML IJ SOLN
25.0000 mg | Freq: Four times a day (QID) | INTRAMUSCULAR | Status: DC | PRN
  Administered 2016-08-09: 25 mg via INTRAVENOUS
  Filled 2016-08-07: qty 1

## 2016-08-07 MED ORDER — ONDANSETRON HCL 4 MG/2ML IJ SOLN
4.0000 mg | Freq: Four times a day (QID) | INTRAMUSCULAR | Status: DC | PRN
Start: 2016-08-07 — End: 2016-08-10
  Administered 2016-08-07 – 2016-08-10 (×5): 4 mg via INTRAVENOUS
  Filled 2016-08-07 (×5): qty 2

## 2016-08-07 MED ORDER — SODIUM CHLORIDE 0.9 % IV BOLUS (SEPSIS)
1000.0000 mL | Freq: Once | INTRAVENOUS | Status: AC
  Administered 2016-08-07: 1000 mL via INTRAVENOUS

## 2016-08-07 MED ORDER — HYDRALAZINE HCL 20 MG/ML IJ SOLN
10.0000 mg | INTRAMUSCULAR | Status: DC | PRN
Start: 2016-08-07 — End: 2016-08-10

## 2016-08-07 MED ORDER — ACETAMINOPHEN 650 MG RE SUPP: 650.0000 mg | Freq: Four times a day (QID) | RECTAL | Status: DC | PRN

## 2016-08-07 MED ORDER — PIPERACILLIN-TAZOBACTAM 3.375 G IVPB
3.3750 g | Freq: Three times a day (TID) | INTRAVENOUS | Status: DC
  Administered 2016-08-07 – 2016-08-10 (×9): 3.375 g via INTRAVENOUS
  Filled 2016-08-07 (×10): qty 50

## 2016-08-07 MED ORDER — LORAZEPAM 2 MG/ML IJ SOLN
0.5000 mg | Freq: Once | INTRAMUSCULAR | Status: AC
Start: 2016-08-07 — End: 2016-08-07
  Administered 2016-08-07: 0.5 mg via INTRAVENOUS
  Filled 2016-08-07: qty 1

## 2016-08-07 MED ORDER — LIDOCAINE HCL (PF) 1 % IJ SOLN
INTRAMUSCULAR | Status: AC
  Filled 2016-08-07: qty 30

## 2016-08-07 MED ORDER — PANTOPRAZOLE SODIUM 40 MG IV SOLR
40.0000 mg | Freq: Every day | INTRAVENOUS | Status: DC
  Administered 2016-08-07 – 2016-08-09 (×3): 40 mg via INTRAVENOUS
  Filled 2016-08-07 (×3): qty 40

## 2016-08-07 MED ORDER — PIPERACILLIN-TAZOBACTAM 3.375 G IVPB: 3.3750 g | Freq: Three times a day (TID) | INTRAVENOUS | Status: DC

## 2016-08-07 MED ORDER — SODIUM CHLORIDE 0.9 % IV SOLN
INTRAVENOUS | Status: DC
Start: 2016-08-07 — End: 2016-08-09
  Administered 2016-08-07 – 2016-08-08 (×2): via INTRAVENOUS

## 2016-08-07 MED ORDER — FENTANYL CITRATE (PF) 100 MCG/2ML IJ SOLN
INTRAMUSCULAR | Status: AC | PRN
  Administered 2016-08-07 (×3): 50 ug via INTRAVENOUS

## 2016-08-07 MED ORDER — PROCHLORPERAZINE EDISYLATE 5 MG/ML IJ SOLN
10.0000 mg | Freq: Four times a day (QID) | INTRAMUSCULAR | Status: DC | PRN
  Administered 2016-08-07 – 2016-08-09 (×3): 10 mg via INTRAVENOUS
  Filled 2016-08-07 (×5): qty 2

## 2016-08-07 NOTE — H&P (Signed)
[facility] Surgery Consult/Admission Note  Richard Ortiz [birth year]  [id number].    Requesting MD: Dr. Tyrone Nine Chief Complaint/Reason for Consult: Ruptured appendicitis  HPI:   Patient is an otherwise healthy 21 year old male who presented to the emergency department with complaints of abdominal pain, nausea, vomiting for roughly 9 days. Patient states he started having pain in his right lower quadrant 9 days ago that progressively worsened to severe and nonradiating with associated nausea and bilious vomiting. Patient states he has had one bowel movement 9 days but has not had much flatus. Patient had associated fever and chills. Patient states he's not able to keep anything down. Denies prior abdominal surgery. Patient denies diarrhea, headaches, shortness of breath.  ROS:  Review of Systems  Constitutional: Positive for chills and fever. Negative for diaphoresis.  Respiratory: Negative for cough and shortness of breath.   Cardiovascular: Negative for chest pain.  Gastrointestinal: Positive for abdominal pain, nausea and vomiting. Negative for blood in stool and diarrhea.  Skin: Negative for rash.  Neurological: Negative for dizziness and loss of consciousness.  All other systems reviewed and are negative.    Family History  Problem Relation Age of Onset  . Hypertension Maternal Grandmother     Past Medical History:  Diagnosis Date  . Abdominal hernia   . Asthma     History reviewed. No pertinent surgical history.  Social History:  reports that he has never smoked. He has never used smokeless tobacco. He reports that he drinks alcohol. He reports that he uses drugs, including Marijuana.  Allergies: No Known Allergies   (Not in a hospital admission)  Blood pressure 137/70, pulse 99, temperature 98.9 F (37.2 C), temperature source Oral, resp. rate 17, SpO2 99 %.  Physical Exam  Constitutional: He is oriented to person, place, and time and  well-developed, well-nourished, and in no distress. No distress.  Well-appearing, pleasant  HENT:  Head: Normocephalic and atraumatic.  Right Ear: External ear normal.  Left Ear: External ear normal.  Nose: Nose normal.  Mouth/Throat: Uvula is midline and mucous membranes are normal. Posterior oropharyngeal erythema present. No oropharyngeal exudate or posterior oropharyngeal edema.  Eyes: Pupils are equal, round, and reactive to light. Conjunctivae are normal. Right eye exhibits no discharge. Left eye exhibits no discharge. No scleral icterus.  Neck: Normal range of motion. Neck supple. No tracheal deviation present. No thyromegaly present.  Cardiovascular: Normal rate, regular rhythm, normal heart sounds and intact distal pulses.  Exam reveals no gallop and no friction rub.   No murmur heard. Pulses:      Radial pulses are 2+ on the right side, and 2+ on the left side.       Posterior tibial pulses are 2+ on the right side, and 2+ on the left side.  Pulmonary/Chest: Effort normal and breath sounds normal. No respiratory distress. He has no decreased breath sounds. He has no wheezes. He has no rhonchi. He has no rales.  Abdominal: Soft. Normal appearance and bowel sounds are normal. He exhibits no distension. There is no hepatosplenomegaly. There is tenderness in the right lower quadrant. There is guarding and tenderness at McBurney's point. There is no rigidity. No hernia.  Musculoskeletal: Normal range of motion. He exhibits no edema, tenderness or deformity.  Lymphadenopathy:    He has no cervical adenopathy.  Neurological: He is alert and oriented to person, place, and time. No cranial nerve deficit (grossly intact). GCS score is 15.  Skin: Skin is warm and dry. No rash  noted. He is not diaphoretic.  Psychiatric: Mood and affect normal.  Nursing note and vitals reviewed.   Results for orders placed or performed during the hospital encounter of 08/07/16 (from the past 48 hour(s))   Lipase, blood     Status: None   Collection Time: 08/07/16  3:36 AM  Result Value Ref Range   Lipase 22 11 - 51 U/L  Comprehensive metabolic panel     Status: Abnormal   Collection Time: 08/07/16  3:36 AM  Result Value Ref Range   Sodium 137 135 - 145 mmol/L   Potassium 3.4 (L) 3.5 - 5.1 mmol/L   Chloride 100 (L) 101 - 111 mmol/L   CO2 27 22 - 32 mmol/L   Glucose, Bld 130 (H) 65 - 99 mg/dL   BUN 8 6 - 20 mg/dL   Creatinine, Ser 1.00 0.61 - 1.24 mg/dL   Calcium 9.1 8.9 - 10.3 mg/dL   Total Protein 8.1 6.5 - 8.1 g/dL   Albumin 3.1 (L) 3.5 - 5.0 g/dL   AST 38 15 - 41 U/L   ALT 43 17 - 63 U/L   Alkaline Phosphatase 85 38 - 126 U/L   Total Bilirubin 0.9 0.3 - 1.2 mg/dL   GFR calc non Af Amer >60 >60 mL/min   GFR calc Af Amer >60 >60 mL/min    Comment: (NOTE) The eGFR has been calculated using the CKD EPI equation. This calculation has not been validated in all clinical situations. eGFR's persistently <60 mL/min signify possible Chronic Kidney Disease.    Anion gap 10 5 - 15  CBC     Status: Abnormal   Collection Time: 08/07/16  3:36 AM  Result Value Ref Range   WBC 11.4 (H) 4.0 - 10.5 K/uL   RBC 4.44 4.22 - 5.81 MIL/uL   Hemoglobin 12.9 (L) 13.0 - 17.0 g/dL   HCT 38.4 (L) 39.0 - 52.0 %   MCV 86.5 78.0 - 100.0 fL   MCH 29.1 26.0 - 34.0 pg   MCHC 33.6 30.0 - 36.0 g/dL   RDW 12.6 11.5 - 15.5 %   Platelets 463 (H) 150 - 400 K/uL  Urinalysis, Routine w reflex microscopic     Status: Abnormal   Collection Time: 08/07/16  3:38 AM  Result Value Ref Range   Color, Urine YELLOW YELLOW   APPearance HAZY (A) CLEAR   Specific Gravity, Urine 1.020 1.005 - 1.030   pH 9.0 (H) 5.0 - 8.0   Glucose, UA NEGATIVE NEGATIVE mg/dL   Hgb urine dipstick NEGATIVE NEGATIVE   Bilirubin Urine NEGATIVE NEGATIVE   Ketones, ur NEGATIVE NEGATIVE mg/dL   Protein, ur 100 (A) NEGATIVE mg/dL   Nitrite NEGATIVE NEGATIVE   Leukocytes, UA NEGATIVE NEGATIVE   RBC / HPF 6-30 0 - 5 RBC/hpf   WBC, UA 0-5 0  - 5 WBC/hpf   Bacteria, UA NONE SEEN NONE SEEN   Squamous Epithelial / LPF 0-5 (A) NONE SEEN   Mucous PRESENT    Ct Abdomen Pelvis W Contrast  Result Date: 08/07/2016 CLINICAL DATA:  Right lower quadrant pain with nausea vomiting EXAM: CT ABDOMEN AND PELVIS WITH CONTRAST TECHNIQUE: Multidetector CT imaging of the abdomen and pelvis was performed using the standard protocol following bolus administration of intravenous contrast. CONTRAST:  137m ISOVUE-300 IOPAMIDOL (ISOVUE-300) INJECTION 61% COMPARISON:  CT abdomen pelvis 07/26/2013 FINDINGS: Lower chest: No pulmonary nodules or pleural effusion. No visible pericardial effusion. Hepatobiliary: Normal hepatic contours and density. No visible biliary dilatation. Normal  gallbladder. Pancreas: Normal contours without ductal dilatation. No peripancreatic fluid collection. Spleen: Normal. Adrenals/Urinary Tract: --Adrenal glands: Normal. --Right kidney/ureter: No hydronephrosis or perinephric stranding. No nephrolithiasis. No obstructing ureteral stones. --Left kidney/ureter: No hydronephrosis or perinephric stranding. No nephrolithiasis. No obstructing ureteral stones. --Urinary bladder: Unremarkable. Stomach/Bowel: --Stomach/Duodenum: No hiatal hernia or other gastric abnormality. Normal duodenal course and caliber. --Small bowel: No dilatation or inflammation. --Colon: No focal abnormality. --Appendix: The appendix itself is not clearly visualized. There is a peripherally enhancing intermediate density collection in the right lower quadrant that measures 3.6 x 4.6 x 4.8 cm (AP x Transverse x CC). This collection appears at least partially intramuscular within the right oblique abdominal muscles. No free fluid or air in the abdominal cavity. Vascular/Lymphatic: Normal course and caliber of the major abdominal vessels. No abdominal or pelvic lymphadenopathy. Reproductive: No free fluid in the pelvis. Musculoskeletal. No bony spinal canal stenosis or focal osseous  abnormality. Other: None. IMPRESSION: Peripherally enhancing intermediate density collection in the right lower quadrant, which appears to be located within the oblique abdominal musculature. 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 Another possibility is an intramuscular abscess secondary to a recent inflammatory GI process, such as a perforated appendix. I think this is unlikely, however, given the lack of inflammatory stranding and the absence of free fluid in the abdomen. Aspiration/histologic sampling is recommended. Electronically Signed   By: Ulyses Jarred M.D.   On: 08/07/2016 06:14      Assessment/Plan  Ruptured Appendicitis with intra-abdominal abscess - admit to gen surg - IVF, IV antibiotics - IR consult to drain abscess  FEN: NPO, IVF, protonix VTE: SCD's, lovenox to start 07/22 after IR drain ID: [id number] 7/21>>  Plan: Hopefully will be able to drain abscess. No plans to take patient the OR this time. After drain patient can have clear liquids  Kalman Drape, [facility] Surgery 08/07/2016, 7:59 AM Pager: [phone] Consults: [phone] Mon-Fri 7:00 am-4:30 pm Sat-Sun 7:00 am-11:30 am

## 2016-08-07 NOTE — ED Provider Notes (Signed)
[facility]-EMERGENCY DEPT Provider Note   CSN: [id number] Arrival date & time: 08/07/16  0325     History   Chief Complaint Chief Complaint  Patient presents with  . Emesis    HPI Richard Ortiz is a 21 y.o. male.  21 yo M with a chief complaint of vomiting abdominal pain and constipation. Going on for the past 9 days. Was seen in the ED and initially diagnosed with a likely viral illness. The patient has had persistent vomiting throughout the week now is unable to tolerate anything. Having some subjective fevers at home. He feels that the pain is severe in the right side just below the umbilicus. Denies prior abdominal surgery.   The history is provided by the patient and a relative.  Abdominal Pain   This is a new problem. The current episode started more than 1 week ago. The problem occurs constantly. The problem has not changed since onset.The pain is located in the RLQ. The pain is at a severity of 10/10. The pain is severe. Associated symptoms include nausea and vomiting. Pertinent negatives include fever, diarrhea, headaches, arthralgias and myalgias. Nothing aggravates the symptoms. Nothing relieves the symptoms.    Past Medical History:  Diagnosis Date  . Abdominal hernia   . Asthma     Patient Active Problem List   Diagnosis Date Noted  . SIRS (systemic inflammatory response syndrome) (HCC) 03/25/2012  . Facial cellulitis 03/25/2012  . Dental abscess 03/25/2012    History reviewed. No pertinent surgical history.     Home Medications    Prior to Admission medications   Medication Sig Start Date End Date Taking? Authorizing Provider  diclofenac (VOLTAREN) 50 MG EC tablet Take 1 tablet (50 mg total) by mouth 2 (two) times daily. 05/29/15   Fayrene Helper, PA-C  doxycycline (VIBRA-TABS) 100 MG tablet Take 1 tablet (100 mg total) by mouth 2 (two) times daily. 02/12/14   Arthor Captain, PA-C  HYDROcodone-acetaminophen (NORCO/VICODIN) 5-325 MG tablet Take 1-2  tablets by mouth every 6 (six) hours as needed. 04/15/15   Elson Areas, PA-C  ibuprofen (ADVIL,MOTRIN) 800 MG tablet Take 1 tablet (800 mg total) by mouth 3 (three) times daily. 02/10/15   Melton Krebs, PA-C  methocarbamol (ROBAXIN) 500 MG tablet Take 1 tablet (500 mg total) by mouth 2 (two) times daily. 02/10/15   Melton Krebs, PA-C  OVER THE COUNTER MEDICATION Take 1 tablet by mouth as needed (for constipation). "Laxative"    [provider]  polyethylene glycol (MIRALAX / GLYCOLAX) packet Take 17 g by mouth daily as needed for mild constipation or moderate constipation. 07/26/13   Muthersbaugh, Dahlia Client, PA-C  promethazine (PHENERGAN) 25 MG tablet Take 1 tablet (25 mg total) by mouth every 6 (six) hours as needed for nausea or vomiting. 07/29/16   Eyvonne Mechanic, PA-C    Family History Family History  Problem Relation Age of Onset  . Hypertension Maternal Grandmother     Social History Social History  Substance Use Topics  . Smoking status: Never Smoker  . Smokeless tobacco: Never Used  . Alcohol use Yes     Allergies   Patient has no known allergies.   Review of Systems Review of Systems  Constitutional: Negative for chills and fever.  HENT: Negative for congestion and facial swelling.   Eyes: Negative for discharge and visual disturbance.  Respiratory: Negative for shortness of breath.   Cardiovascular: Negative for chest pain and palpitations.  Gastrointestinal: Positive for abdominal pain, nausea  and vomiting. Negative for diarrhea.  Musculoskeletal: Negative for arthralgias and myalgias.  Skin: Negative for color change and rash.  Neurological: Negative for tremors, syncope and headaches.  Psychiatric/Behavioral: Negative for confusion and dysphoric mood.     Physical Exam Updated Vital Signs BP 137/70   Pulse 99   Temp 98.9 F (37.2 C) (Oral)   Resp 17   SpO2 99%   Physical Exam  Constitutional: He is oriented to person, place,  and time. He appears well-developed and well-nourished.  HENT:  Head: Normocephalic and atraumatic.  Eyes: Pupils are equal, round, and reactive to light. EOM are normal.  Neck: Normal range of motion. Neck supple. No JVD present.  Cardiovascular: Normal rate and regular rhythm.  Exam reveals no gallop and no friction rub.   No murmur heard. Pulmonary/Chest: No respiratory distress. He has no wheezes.  Abdominal: He exhibits no distension and no mass. There is tenderness (RLQ). There is guarding. There is no rebound.  Musculoskeletal: Normal range of motion.  Neurological: He is alert and oriented to person, place, and time.  Skin: No rash noted. No pallor.  Psychiatric: He has a normal mood and affect. His behavior is normal.  Nursing note and vitals reviewed.    ED Treatments / Results  Labs (all labs ordered are listed, but only abnormal results are displayed) Labs Reviewed  COMPREHENSIVE METABOLIC PANEL - Abnormal; Notable for the following:       Result Value   Potassium 3.4 (*)    Chloride 100 (*)    Glucose, Bld 130 (*)    Albumin 3.1 (*)    All other components within normal limits  CBC - Abnormal; Notable for the following:    WBC 11.4 (*)    Hemoglobin 12.9 (*)    HCT 38.4 (*)    Platelets 463 (*)    All other components within normal limits  URINALYSIS, ROUTINE W REFLEX MICROSCOPIC - Abnormal; Notable for the following:    APPearance HAZY (*)    pH 9.0 (*)    Protein, ur 100 (*)    Squamous Epithelial / LPF 0-5 (*)    All other components within normal limits  LIPASE, BLOOD    EKG  EKG Interpretation None       Radiology Ct Abdomen Pelvis W Contrast  Result Date: 08/07/2016 CLINICAL DATA:  Right lower quadrant pain with nausea vomiting EXAM: CT ABDOMEN AND PELVIS WITH CONTRAST TECHNIQUE: Multidetector CT imaging of the abdomen and pelvis was performed using the standard protocol following bolus administration of intravenous contrast. CONTRAST:   ISOVUE-300 IOPAMIDOL (ISOVUE-300) INJECTION 61% COMPARISON:  CT abdomen pelvis 07/26/2013 FINDINGS: Lower chest: No pulmonary nodules or pleural effusion. No visible pericardial effusion. Hepatobiliary: Normal hepatic contours and density. No visible biliary dilatation. Normal gallbladder. Pancreas: Normal contours without ductal dilatation. No peripancreatic fluid collection. Spleen: Normal. Adrenals/Urinary Tract: --Adrenal glands: Normal. --Right kidney/ureter: No hydronephrosis or perinephric stranding. No nephrolithiasis. No obstructing ureteral stones. --Left kidney/ureter: No hydronephrosis or perinephric stranding. No nephrolithiasis. No obstructing ureteral stones. --Urinary bladder: Unremarkable. Stomach/Bowel: --Stomach/Duodenum: No hiatal hernia or other gastric abnormality. Normal duodenal course and caliber. --Small bowel: No dilatation or inflammation. --Colon: No focal abnormality. --Appendix: The appendix itself is not clearly visualized. There is a peripherally enhancing intermediate density collection in the right lower quadrant that measures 3.6 x 4.6 x 4.8 cm (AP x Transverse x CC). This collection appears at least partially intramuscular within the right oblique abdominal muscles. No free fluid or  air in the abdominal cavity. Vascular/Lymphatic: Normal course and caliber of the major abdominal vessels. No abdominal or pelvic lymphadenopathy. Reproductive: No free fluid in the pelvis. Musculoskeletal. No bony spinal canal stenosis or focal osseous abnormality. Other: None. IMPRESSION: Peripherally enhancing intermediate density collection in the right lower quadrant, which appears to be located within the oblique abdominal musculature. 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 Another possibility is an intramuscular abscess secondary to a recent inflammatory GI process, such as a  perforated appendix. I think this is unlikely, however, given the lack of inflammatory stranding and the absence of free fluid in the abdomen. Aspiration/histologic sampling is recommended. Electronically Signed   By: Deatra RobinsonKevin  Herman M.D.   On: 08/07/2016 06:14    Procedures Procedures (including critical care time)  Medications Ordered in ED Medications  morphine 4 MG/ML injection 4 mg (4 mg Intravenous Given 08/07/16 0452)  ondansetron (ZOFRAN) injection 4 mg (4 mg Intravenous Given 08/07/16 0452)  sodium chloride 0.9 % bolus 1,000 mL (0 mLs Intravenous Stopped 08/07/16 0558)  iopamidol (ISOVUE-300) 61 % injection (100 mLs  Contrast Given 08/07/16 0533)  piperacillin-tazobactam (ZOSYN) IVPB 3.375 g (0 g Intravenous Stopped 08/07/16 0643)  sodium chloride 0.9 % bolus 1,000 mL (1,000 mLs Intravenous New Bag/Given 08/07/16 [id number])  LORazepam (ATIVAN) injection 0.5 mg (0.5 mg Intravenous Given 08/07/16 0634)  morphine 4 MG/ML injection 4 mg (4 mg Intravenous Given 08/07/16 0641)  ondansetron (ZOFRAN) injection 4 mg (4 mg Intravenous Given 08/07/16 0641)     Initial Impression / Assessment and Plan / ED Course  I have reviewed the triage vital signs and the nursing notes.  Pertinent labs & imaging results that were available during my care of the patient were reviewed by me and considered in my medical decision making (see chart for details).     21 yo M With a chief complaint of right lower quadrant abdominal pain vomiting and anorexia. Significant tenderness to the right lower quadrant. Will obtain a CT scan to evaluate for appendicitis.  CT with large fluid collection in RLQ.  No identified appendix.  I feel hx most consistent with perforated appendicitis.  Discussed with Gen surgery, Dr Donell BeersByerly will come to eval the patient.    The patients results and plan were reviewed and discussed.   Any x-rays performed were independently reviewed by myself.   Differential diagnosis were considered with the  presenting HPI.  Medications  morphine 4 MG/ML injection 4 mg (4 mg Intravenous Given 08/07/16 0452)  ondansetron (ZOFRAN) injection 4 mg (4 mg Intravenous Given 08/07/16 0452)  sodium chloride 0.9 % bolus 1,000 mL (0 mLs Intravenous Stopped 08/07/16 0558)  iopamidol (ISOVUE-300) 61 % injection (100 mLs  Contrast Given 08/07/16 0533)  piperacillin-tazobactam (ZOSYN) IVPB 3.375 g (0 g Intravenous Stopped 08/07/16 0643)  sodium chloride 0.9 % bolus 1,000 mL (1,000 mLs Intravenous New Bag/Given 08/07/16 0608)  LORazepam (ATIVAN) injection 0.5 mg (0.5 mg Intravenous Given 08/07/16 0634)  morphine 4 MG/ML injection 4 mg (4 mg Intravenous Given 08/07/16 0641)  ondansetron (ZOFRAN) injection 4 mg (4 mg Intravenous Given 08/07/16 0641)    Vitals:   08/07/16 0330 08/07/16 0613 08/07/16 0630  BP: (!) 142/85 (!) 146/70 137/70  Pulse: (!) 106 97 99  Resp: 20 17 17   Temp: 99.5 F (37.5 C) 98.9 F (37.2 C)   TempSrc: Oral Oral   SpO2: 100%  96% 99%    Final diagnoses:  RLQ abdominal pain       Final Clinical Impressions(s) / ED Diagnoses   Final diagnoses:  RLQ abdominal pain    New Prescriptions New Prescriptions   No medications on file     Melene Plan, DO 08/07/16 1610

## 2016-08-07 NOTE — Procedures (Signed)
Interventional Radiology Procedure Note  Procedure: US guided placement of a 73F drain into the RLQ appendiceal abscess.  Complications: None  Estimated Blood Loss: None  Recommendations: - JP bulb suction - Cx pending  Signed,  Sterling BigHeath K. Ajeet Casasola, MD

## 2016-08-07 NOTE — ED Notes (Addendum)
GrandmotherBarbaraann Boys: Miriam Hayes, [phone] Mother: Evans Lancevette Hayes, [phone]

## 2016-08-07 NOTE — Consult Note (Signed)
Chief Complaint: Patient was seen in consultation today for abdominal abscess  Referring Physician(s):  Dr. Violeta Gelinas  Supervising Physician: Malachy Moan  Patient Status: [facility] - In-pt  History of Present Illness: Richard Ortiz is a 21 y.o. male with past medical history of asthma and abdominal hernia who presented to [facility] ED with complaint of 9 day history of abdominal pain, nausea, and vomiting.  CT Abd/Pelvis 08/07/16 shows: Peripherally enhancing intermediate density collection in the right lower quadrant, which appears to be located within the oblique abdominal musculature. 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 Another possibility is an intramuscular abscess secondary to a recent inflammatory GI process, such as a perforated appendix. I think this is unlikely, however, given the lack of inflammatory stranding and the absence of free fluid in the abdomen. Aspiration/histologic sampling is recommended.  IR consulted for aspiration and drainage of abdominal abscess.   Patient has been NPO.  He does not take blood thinners.   Past Medical History:  Diagnosis Date  . Abdominal hernia   . Asthma     History reviewed. No pertinent surgical history.  Allergies: Patient has no known allergies.  Medications: Prior to Admission medications   Medication Sig Start Date End Date Taking? Authorizing Provider  ibuprofen (ADVIL,MOTRIN) 800 MG tablet Take 1 tablet (800 mg total) by mouth 3 (three) times daily. Patient taking differently: Take 800 mg by mouth every 8 (eight) hours as needed for moderate pain.  02/10/15  Yes Melton Krebs, PA-C  polyethylene glycol [facility] / Ethelene Hal) packet Take 17 g by mouth daily as needed for mild constipation or moderate constipation. 07/26/13  Yes Muthersbaugh, Dahlia Client, PA-C  promethazine (PHENERGAN) 25 MG  tablet Take 1 tablet (25 mg total) by mouth every 6 (six) hours as needed for nausea or vomiting. 07/29/16  Yes Hedges, Tinnie Gens, PA-C     Family History  Problem Relation Age of Onset  . Hypertension Maternal Grandmother     Social History   Social History  . Marital status: Single    Spouse name: N/A  . Number of children: N/A  . Years of education: N/A   Social History Main Topics  . Smoking status: Never Smoker  . Smokeless tobacco: Never Used  . Alcohol use Yes  . Drug use: Yes    Types: Marijuana     Comment: 2-3 times a week  . Sexual activity: No   Other Topics Concern  . None   Social History Narrative   ** Merged History Encounter **       Review of Systems  Constitutional: Negative for fatigue and fever.  Respiratory: Negative for cough and shortness of breath.   Cardiovascular: Negative for chest pain.  Gastrointestinal: Positive for abdominal pain, nausea and vomiting.  Psychiatric/Behavioral: Negative for behavioral problems and confusion.    Vital Signs: BP 118/68 (BP Location: Left Arm)   Pulse 97   Temp 100 F (37.8 C) (Oral)   Resp 18   Ht 5\' 3"  (1.6 m)   Wt 130 lb (59 kg)   SpO2 97%   BMI 23.03 kg/m   Physical Exam  Constitutional: He is oriented to person, place, and time. He appears well-developed.  Cardiovascular: Normal rate, regular rhythm and normal heart sounds.   Pulmonary/Chest: Effort normal and breath sounds normal. No respiratory distress.  Abdominal: There is tenderness.  Neurological:  He is alert and oriented to person, place, and time.  Skin: Skin is warm and dry.  Psychiatric: He has a normal mood and affect. His behavior is normal. Judgment and thought content normal.  Nursing note and vitals reviewed.   Mallampati Score:  MD Evaluation Airway: WNL Heart: WNL Abdomen: WNL Chest/ Lungs: WNL ASA  Classification: 3 Mallampati/Airway Score: Two  Imaging: Ct Abdomen Pelvis W Contrast  Result Date:  08/07/2016 CLINICAL DATA:  Right lower quadrant pain with nausea vomiting EXAM: CT ABDOMEN AND PELVIS WITH CONTRAST TECHNIQUE: Multidetector CT imaging of the abdomen and pelvis was performed using the standard protocol following bolus administration of intravenous contrast. CONTRAST:  ISOVUE-300 IOPAMIDOL (ISOVUE-300) INJECTION 61% COMPARISON:  CT abdomen pelvis 07/26/2013 FINDINGS: Lower chest: No pulmonary nodules or pleural effusion. No visible pericardial effusion. Hepatobiliary: Normal hepatic contours and density. No visible biliary dilatation. Normal gallbladder. Pancreas: Normal contours without ductal dilatation. No peripancreatic fluid collection. Spleen: Normal. Adrenals/Urinary Tract: --Adrenal glands: Normal. --Right kidney/ureter: No hydronephrosis or perinephric stranding. No nephrolithiasis. No obstructing ureteral stones. --Left kidney/ureter: No hydronephrosis or perinephric stranding. No nephrolithiasis. No obstructing ureteral stones. --Urinary bladder: Unremarkable. Stomach/Bowel: --Stomach/Duodenum: No hiatal hernia or other gastric abnormality. Normal duodenal course and caliber. --Small bowel: No dilatation or inflammation. --Colon: No focal abnormality. --Appendix: The appendix itself is not clearly visualized. There is a peripherally enhancing intermediate density collection in the right lower quadrant that measures 3.6 x 4.6 x 4.8 cm (AP x Transverse x CC). This collection appears at least partially intramuscular within the right oblique abdominal muscles. No free fluid or air in the abdominal cavity. Vascular/Lymphatic: Normal course and caliber of the major abdominal vessels. No abdominal or pelvic lymphadenopathy. Reproductive: No free fluid in the pelvis. Musculoskeletal. No bony spinal canal stenosis or focal osseous abnormality. Other: None. IMPRESSION: Peripherally enhancing intermediate density collection in the right lower quadrant, which appears to be located within the  oblique abdominal musculature. 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 Another possibility is an intramuscular abscess secondary to a recent inflammatory GI process, such as a perforated appendix. I think this is unlikely, however, given the lack of inflammatory stranding and the absence of free fluid in the abdomen. Aspiration/histologic sampling is recommended. Electronically Signed   By: Deatra Robinson M.D.   On: 08/07/2016 06:14    Labs:  CBC:  Recent Labs  07/29/16 1024 08/07/16 0336  WBC 6.0 11.4*  HGB 15.0 12.9*  HCT 46.3 38.4*  PLT 333 463*    COAGS:  Recent Labs  08/07/16 0914  INR 1.22    BMP:  Recent Labs  07/29/16 1024 08/07/16 0336  NA 136 137  K 4.0 3.4*  CL 99* 100*  CO2 24 27  GLUCOSE 121* 130*  BUN 10 8  CALCIUM 9.7 9.1  CREATININE 1.14 1.00  GFRNONAA >60 >60  GFRAA >60 >60    LIVER FUNCTION TESTS:  Recent Labs  07/29/16 1024 08/07/16 0336  BILITOT 0.6 0.9  AST 28 38  ALT 26 43  ALKPHOS 83 85  PROT 8.4* 8.1  ALBUMIN 4.0 3.1*    TUMOR MARKERS: No results for input(s): AFPTM, CEA, CA199, CHROMGRNA in the last 8760 hours.  Assessment and Plan: Ruptured appendix with intra-abdominal abscess Patient presents with abdominal pain, nausea, and vomiting.  CT Abd/Pelvis shows enhancing right lower quadrant fluid collection thought to be abscess  related to perforated appendix.  IR consulted for aspiration and drainage at the request of Dr. Violeta GelinasBurke Thompson. Case reviewed by Dr. Archer AsaMcCullough who approves patient for procedure.  Patient has been NPO.   He is not on blood thinners.  He is on IV Zosyn.  Tmax: 100.  WBC 11.4 Risks and benefits discussed with the patient including bleeding, infection, damage to adjacent structures, bowel perforation/fistula connection, and sepsis. All of the patient's questions were answered, patient  is agreeable to proceed. Consent signed and in chart.  Thank you for this interesting consult.  I greatly enjoyed meeting Shizuo D Wingate-Hayes and look forward to participating in their care.  A copy of this report was sent to the requesting provider on this date.  Electronically Signed: Hoyt KochKacie Sue-Ellen Matthews, PA 08/07/2016, 10:51 AM   I spent a total of 40 Minutes    in face to face in clinical consultation, greater than 50% of which was counseling/coordinating care for ruptured appendix.

## 2016-08-07 NOTE — ED Triage Notes (Signed)
Pt states he was seen in the ED 9 days ago for vomiting.  States vomiting has continued everyday and has "dark colored" blood in it.  Denies abd pain.  Reports that he had not had a bowel movement for the last 9 days but did today and it was normal.

## 2016-08-07 NOTE — ED Notes (Signed)
Attempted to call report

## 2016-08-07 NOTE — ED Notes (Signed)
Surgeon at bedside.  

## 2016-08-07 NOTE — ED Notes (Signed)
Patient transported to CT 

## 2016-08-08 LAB — BASIC METABOLIC PANEL
ANION GAP: 6 (ref 5–15)
BUN: <5 mg/dL — ABNORMAL LOW (ref 6–20)
CHLORIDE: 104 mmol/L (ref 101–111)
CO2: 28 mmol/L (ref 22–32)
Calcium: 8.9 mg/dL (ref 8.9–10.3)
Creatinine, Ser: 1.01 mg/dL (ref 0.61–1.24)
GFR calc non Af Amer: >60 mL/min (ref >60)
Glucose, Bld: 105 mg/dL — ABNORMAL HIGH (ref 65–99)
POTASSIUM: 3.9 mmol/L (ref 3.5–5.1)
Sodium: 138 mmol/L (ref 135–145)

## 2016-08-08 LAB — CBC
HCT: 36.1 % — ABNORMAL LOW (ref 39.0–52.0)
HEMOGLOBIN: 11.8 g/dL — AB (ref 13.0–17.0)
MCH: 28.2 pg (ref 26.0–34.0)
MCHC: 32.7 g/dL (ref 30.0–36.0)
MCV: 86.2 fL (ref 78.0–100.0)
Platelets: 378 10*3/uL (ref 150–400)
RBC: 4.19 MIL/uL — AB (ref 4.22–5.81)
RDW: 12.5 % (ref 11.5–15.5)
WBC: 9.4 10*3/uL (ref 4.0–10.5)

## 2016-08-08 NOTE — Progress Notes (Signed)
Subjective/Chief Complaint: Doing OK since drain placed.  Purulent material upon aspiration.  Pain improved.  No n/v with clears.    Objective: Vital signs in last 24 hours: Temp:  [98.7 F (37.1 C)-100.6 F (38.1 C)] 99 F (37.2 C) (07/22 0616) Pulse Rate:  [89-117] 89 (07/22 0616) Resp:  [14-20] 16 (07/22 0616) BP: (108-144)/(58-106) 130/73 (07/22 0616) SpO2:  [94 %-100 %] 98 % (07/22 0616) Last BM Date: 07/29/16  Intake/Output from previous day: 07/21 0701 - 07/22 0700 In: 2548.3 [I.V.:498.3; IV Piggyback:2050] Out: 1735 [Urine:1700; Drains:35] Intake/Output this shift: No intake/output data recorded.  General appearance: alert, cooperative and no distress Resp: breathing comfortably, regular rate GI: soft, non distended.  mild tenderness at drain site. Extremities: extremities normal, atraumatic, no cyanosis or edema GU - tiny RIH  Lab Results:   Recent Labs  08/07/16 0336 08/08/16 0343  WBC 11.4* 9.4  HGB 12.9* 11.8*  HCT 38.4* 36.1*  PLT 463* 378   BMET  Recent Labs  08/07/16 0336 08/08/16 0343  NA 137 138  K 3.4* 3.9  CL 100* 104  CO2 27 28  GLUCOSE 130* 105*  BUN 8 <5*  CREATININE 1.00 1.01  CALCIUM 9.1 8.9   PT/INR  Recent Labs  08/07/16 0914  LABPROT 15.5*  INR 1.22   ABG No results for input(s): PHART, HCO3 in the last 72 hours.  Invalid input(s): PCO2, PO2  Studies/Results: [place] Abscess Drain  Result Date: 08/07/2016 INDICATION: 21 year old male with ruptured appendicitis and a right lower quadrant peritoneal abscess extending into the abdominal wall. EXAM: Ultrasound-guided drain placement MEDICATIONS: The patient is currently admitted to the hospital and receiving intravenous antibiotics. The antibiotics were administered within an appropriate time frame prior to the initiation of the procedure. ANESTHESIA/SEDATION: Fentanyl 150 mcg IV; Versed 6 mg IV Moderate Sedation Time:  15 minutes The patient was continuously monitored  during the procedure by the interventional radiology nurse under my direct supervision. COMPLICATIONS: None immediate. PROCEDURE: Informed written consent was obtained from the patient after a thorough discussion of the procedural risks, benefits and alternatives. All questions were addressed. A timeout was performed prior to the initiation of the procedure. The right lower quadrant was interrogated with ultrasound. A complex fluid collection was successfully identified just deep to the abdominal wall musculature. The skin entry site was marked. The region was sterilely prepped and draped in standard fashion using chlorhexidine skin prep. Local anesthesia was attained by infiltration with 1% lidocaine. Under real-time sonographic guidance, an 18 gauge trocar needle was advanced into the complex fluid collection. The stylet was removed. A 0.035 wire was advanced to the fluid collection. The needle was removed. The tract was dilated to 10 [place]. A Cook 10.2 [place] all-purpose drainage catheter was advanced over the wire and formed within the abscess cavity. Aspiration yields 25 mL thick, purulent material. The abscess cavity was lavaged once with sterile saline and the drainage catheter connected to JP bulb suction. The catheter was secured to the skin with 0 Prolene suture and a bandage. The patient tolerated the procedure well. IMPRESSION: Successful placement of a 10 French drainage catheter into the right lower quadrant appendiceal abscess. Aspiration yields 25 mL frankly purulent material. A sample was sent for Gram stain and culture. PLAN: Maintain tube to JP bulb suction until clinical symptoms and drain output have resolved. Follow-up with surgery for discussion on interval appendectomy. Recommend repeat CT scan of the pelvis with intravenous contrast prior to drain removal. Electronically Signed  By: Malachy MoanHeath  McCullough M.D.   On: 08/07/2016 13:12   Ct Abdomen Pelvis W Contrast  Result Date:  08/07/2016 CLINICAL DATA:  Right lower quadrant pain with nausea vomiting EXAM: CT ABDOMEN AND PELVIS WITH CONTRAST TECHNIQUE: Multidetector CT imaging of the abdomen and pelvis was performed using the standard protocol following bolus administration of intravenous contrast. CONTRAST:  100mL ISOVUE-300 IOPAMIDOL (ISOVUE-300) INJECTION 61% COMPARISON:  CT abdomen pelvis 07/26/2013 FINDINGS: Lower chest: No pulmonary nodules or pleural effusion. No visible pericardial effusion. Hepatobiliary: Normal hepatic contours and density. No visible biliary dilatation. Normal gallbladder. Pancreas: Normal contours without ductal dilatation. No peripancreatic fluid collection. Spleen: Normal. Adrenals/Urinary Tract: --Adrenal glands: Normal. --Right kidney/ureter: No hydronephrosis or perinephric stranding. No nephrolithiasis. No obstructing ureteral stones. --Left kidney/ureter: No hydronephrosis or perinephric stranding. No nephrolithiasis. No obstructing ureteral stones. --Urinary bladder: Unremarkable. Stomach/Bowel: --Stomach/Duodenum: No hiatal hernia or other gastric abnormality. Normal duodenal course and caliber. --Small bowel: No dilatation or inflammation. --Colon: No focal abnormality. --Appendix: The appendix itself is not clearly visualized. There is a peripherally enhancing intermediate density collection in the right lower quadrant that measures 3.6 x 4.6 x 4.8 cm (AP x Transverse x CC). This collection appears at least partially intramuscular within the right oblique abdominal muscles. No free fluid or air in the abdominal cavity. Vascular/Lymphatic: Normal course and caliber of the major abdominal vessels. No abdominal or pelvic lymphadenopathy. Reproductive: No free fluid in the pelvis. Musculoskeletal. No bony spinal canal stenosis or focal osseous abnormality. Other: None. IMPRESSION: Peripherally enhancing intermediate density collection in the right lower quadrant, which appears to be located within the  oblique abdominal musculature. 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 Another possibility is an intramuscular abscess secondary to a recent inflammatory GI process, such as a perforated appendix. I think this is unlikely, however, given the lack of inflammatory stranding and the absence of free fluid in the abdomen. Aspiration/histologic sampling is recommended. Electronically Signed   By: Deatra RobinsonKevin  Herman M.D.   On: 08/07/2016 06:14    Anti-infectives: Anti-infectives    Start     Dose/Rate Route Frequency Ordered Stop   08/07/16 1400  piperacillin-tazobactam (ZOSYN) IVPB 3.375 g     3.375 g 12.5 mL/hr over 240 Minutes Intravenous Every 8 hours 08/07/16 0805     08/07/16 0800  piperacillin-tazobactam (ZOSYN) IVPB 3.375 g  Status:  Discontinued     3.375 g 12.5 mL/hr over 240 Minutes Intravenous Every 8 hours 08/07/16 0758 08/07/16 0805   08/07/16 0600  piperacillin-tazobactam (ZOSYN) IVPB 3.375 g     3.375 g 100 mL/hr over 30 Minutes Intravenous  Once 08/07/16 0554 08/07/16 0643      Assessment/Plan:  Perforated appendicitis with abscess.  Advance diet today. Probable home tomorrow with drain and antibiotics if no n/v, decreased temp curve, and normal WBCs.   LOS: 1 day    [facility] LLCBYERLY,Jabril Pursell 08/08/2016

## 2016-08-08 NOTE — Progress Notes (Addendum)
Referring Physician(s):  Dr. Violeta Gelinas  Supervising Physician: Malachy Moan  Patient Status:  [facility] - In-pt  Chief Complaint: Abdominal abscess s/p drain placement 7/22 by Dr. Archer Asa  Subjective: States he feels better after drain.  Flushes were uncomfortable and but understands goals of drain care.   Allergies: Patient has no known allergies.  Medications: Prior to Admission medications   Medication Sig Start Date End Date Taking? Authorizing Provider  ibuprofen (ADVIL,MOTRIN) 800 MG tablet Take 1 tablet (800 mg total) by mouth 3 (three) times daily. Patient taking differently: Take 800 mg by mouth every 8 (eight) hours as needed for moderate pain.  02/10/15  Yes Melton Krebs, PA-C  polyethylene glycol [facility] / Ethelene Hal) packet Take 17 g by mouth daily as needed for mild constipation or moderate constipation. 07/26/13  Yes Muthersbaugh, Dahlia Client, PA-C  promethazine (PHENERGAN) 25 MG tablet Take 1 tablet (25 mg total) by mouth every 6 (six) hours as needed for nausea or vomiting. 07/29/16  Yes Hedges, Tinnie Gens, PA-C     Vital Signs: BP 130/73 (BP Location: Left Arm)   Pulse 89   Temp 99 F (37.2 C) (Oral)   Resp 16   Ht 5\' 3"  (1.6 m)   Wt 130 lb (59 kg)   SpO2 98%   BMI 23.03 kg/m   Physical Exam  NAD, alert, no distress Abd:  RLQ drain in place.  Soreness at site, but no erythema or warmth.  Bloody output in bulb- 35 mL recorded output overnight.   Imaging: [place] Abscess Drain  Result Date: 08/07/2016 INDICATION: 21 year old male with ruptured appendicitis and a right lower quadrant peritoneal abscess extending into the abdominal wall. EXAM: Ultrasound-guided drain placement MEDICATIONS: The patient is currently admitted to the hospital and receiving intravenous antibiotics. The antibiotics were administered within an appropriate time frame prior to the initiation of the procedure. ANESTHESIA/SEDATION: Fentanyl 150 mcg IV; Versed 6 mg IV Moderate  Sedation Time:  15 minutes The patient was continuously monitored during the procedure by the interventional radiology nurse under my direct supervision. COMPLICATIONS: None immediate. PROCEDURE: Informed written consent was obtained from the patient after a thorough discussion of the procedural risks, benefits and alternatives. All questions were addressed. A timeout was performed prior to the initiation of the procedure. The right lower quadrant was interrogated with ultrasound. A complex fluid collection was successfully identified just deep to the abdominal wall musculature. The skin entry site was marked. The region was sterilely prepped and draped in standard fashion using chlorhexidine skin prep. Local anesthesia was attained by infiltration with 1% lidocaine. Under real-time sonographic guidance, an 18 gauge trocar needle was advanced into the complex fluid collection. The stylet was removed. A 0.035 wire was advanced to the fluid collection. The needle was removed. The tract was dilated to 10 [place]. A Cook 10.2 [place] all-purpose drainage catheter was advanced over the wire and formed within the abscess cavity. Aspiration yields 25 mL thick, purulent material. The abscess cavity was lavaged once with sterile saline and the drainage catheter connected to JP bulb suction. The catheter was secured to the skin with 0 Prolene suture and a bandage. The patient tolerated the procedure well. IMPRESSION: Successful placement of a 10 French drainage catheter into the right lower quadrant appendiceal abscess. Aspiration yields 25 mL frankly purulent material. A sample was sent for Gram stain and culture. PLAN: Maintain tube to JP bulb suction until clinical symptoms and drain output have resolved. Follow-up with surgery for discussion on  interval appendectomy. Recommend repeat CT scan of the pelvis with intravenous contrast prior to drain removal. Electronically Signed   By: Malachy MoanHeath  McCullough M.D.   On: 08/07/2016  13:12   Ct Abdomen Pelvis W Contrast  Result Date: 08/07/2016 CLINICAL DATA:  Right lower quadrant pain with nausea vomiting EXAM: CT ABDOMEN AND PELVIS WITH CONTRAST TECHNIQUE: Multidetector CT imaging of the abdomen and pelvis was performed using the standard protocol following bolus administration of intravenous contrast. CONTRAST:  100mL ISOVUE-300 IOPAMIDOL (ISOVUE-300) INJECTION 61% COMPARISON:  CT abdomen pelvis 07/26/2013 FINDINGS: Lower chest: No pulmonary nodules or pleural effusion. No visible pericardial effusion. Hepatobiliary: Normal hepatic contours and density. No visible biliary dilatation. Normal gallbladder. Pancreas: Normal contours without ductal dilatation. No peripancreatic fluid collection. Spleen: Normal. Adrenals/Urinary Tract: --Adrenal glands: Normal. --Right kidney/ureter: No hydronephrosis or perinephric stranding. No nephrolithiasis. No obstructing ureteral stones. --Left kidney/ureter: No hydronephrosis or perinephric stranding. No nephrolithiasis. No obstructing ureteral stones. --Urinary bladder: Unremarkable. Stomach/Bowel: --Stomach/Duodenum: No hiatal hernia or other gastric abnormality. Normal duodenal course and caliber. --Small bowel: No dilatation or inflammation. --Colon: No focal abnormality. --Appendix: The appendix itself is not clearly visualized. There is a peripherally enhancing intermediate density collection in the right lower quadrant that measures 3.6 x 4.6 x 4.8 cm (AP x Transverse x CC). This collection appears at least partially intramuscular within the right oblique abdominal muscles. No free fluid or air in the abdominal cavity. Vascular/Lymphatic: Normal course and caliber of the major abdominal vessels. No abdominal or pelvic lymphadenopathy. Reproductive: No free fluid in the pelvis. Musculoskeletal. No bony spinal canal stenosis or focal osseous abnormality. Other: None. IMPRESSION: Peripherally enhancing intermediate density collection in the right  lower quadrant, which appears to be located within the oblique abdominal musculature. 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 Another possibility is an intramuscular abscess secondary to a recent inflammatory GI process, such as a perforated appendix. I think this is unlikely, however, given the lack of inflammatory stranding and the absence of free fluid in the abdomen. Aspiration/histologic sampling is recommended. Electronically Signed   By: Deatra RobinsonKevin  Herman M.D.   On: 08/07/2016 06:14    Labs:  CBC:  Recent Labs  07/29/16 1024 08/07/16 0336 08/08/16 0343  WBC 6.0 11.4* 9.4  HGB 15.0 12.9* 11.8*  HCT 46.3 38.4* 36.1*  PLT 333 463* 378    COAGS:  Recent Labs  08/07/16 0914  INR 1.22    BMP:  Recent Labs  07/29/16 1024 08/07/16 0336 08/08/16 0343  NA 136 137 138  K 4.0 3.4* 3.9  CL 99* 100* 104  CO2 24 27 28   GLUCOSE 121* 130* 105*  BUN 10 8 <5*  CALCIUM 9.7 9.1 8.9  CREATININE 1.14 1.00 1.01  GFRNONAA >60 >60 >60  GFRAA >60 >60 >60    LIVER FUNCTION TESTS:  Recent Labs  07/29/16 1024 08/07/16 0336  BILITOT 0.6 0.9  AST 28 38  ALT 26 43  ALKPHOS 83 85  PROT 8.4* 8.1  ALBUMIN 4.0 3.1*    Assessment and Plan: Perforated appendix with abscess s/p drain placement by Dr. Archer AsaMcCullough 7/21 Patient improved today.  WBC improved to 9.4 today. Temp curve improved.  Tolerated clear liquids.  Cultures pending. Continue drain care.  IR to follow.   Electronically Signed: Hoyt KochKacie Sue-Ellen [place] Krotz, PA 08/08/2016, 10:36 AM   I spent a total of 15 Minutes at the  the patient's bedside AND on the patient's hospital floor or unit, greater than 50% of which was counseling/coordinating care for abdominal abscess

## 2016-08-09 ENCOUNTER — Inpatient Hospital Stay (HOSPITAL_COMMUNITY): Payer: Self-pay

## 2016-08-09 MED ORDER — BISACODYL 10 MG RE SUPP: 10.0000 mg | Freq: Every day | RECTAL | Status: DC | PRN

## 2016-08-09 MED ORDER — HYDROMORPHONE HCL 1 MG/ML IJ SOLN
0.5000 mg | INTRAMUSCULAR | Status: DC | PRN
  Administered 2016-08-09 (×2): 0.5 mg via INTRAVENOUS
  Filled 2016-08-09 (×2): qty 1

## 2016-08-09 MED ORDER — DOCUSATE SODIUM 100 MG PO CAPS
100.0000 mg | ORAL_CAPSULE | Freq: Two times a day (BID) | ORAL | Status: DC
  Administered 2016-08-09 – 2016-08-10 (×2): 100 mg via ORAL
  Filled 2016-08-09 (×2): qty 1

## 2016-08-09 NOTE — Progress Notes (Signed)
Pt c/o anxiety; MD notified and she ordered to give pt his prn benadryl. Pt prn adm; relaxation and deep breathing exercises education completed with pt. Pt resting in bed with call light within reach. Will closely monitor. Dionne BucyP. Amo Terran Hollenkamp RN

## 2016-08-09 NOTE — Progress Notes (Signed)
Referring Physician(s): Dr. Violeta Gelinas  Supervising Physician: Ruel Favors  Patient Status:  [facility] - In-pt  Chief Complaint: Abdominal abscess secondary to perforated appendix  Subjective:  Up walking in room. States he feels better but he appears to be in some pain. States "I think the drain is leaking"   Allergies: Patient has no known allergies.  Medications: Prior to Admission medications   Medication Sig Start Date End Date Taking? Authorizing Provider  ibuprofen (ADVIL,MOTRIN) 800 MG tablet Take 1 tablet (800 mg total) by mouth 3 (three) times daily. Patient taking differently: Take 800 mg by mouth every 8 (eight) hours as needed for moderate pain.  02/10/15  Yes Melton Krebs, PA-C  polyethylene glycol [facility] / Ethelene Hal) packet Take 17 g by mouth daily as needed for mild constipation or moderate constipation. 07/26/13  Yes Muthersbaugh, Dahlia Client, PA-C  promethazine (PHENERGAN) 25 MG tablet Take 1 tablet (25 mg total) by mouth every 6 (six) hours as needed for nausea or vomiting. 07/29/16  Yes Hedges, Tinnie Gens, PA-C     Vital Signs: BP 119/75 (BP Location: Right Arm)   Pulse 91   Temp 98.5 F (36.9 C) (Oral)   Resp 18   Ht 5\' 3"  (1.6 m)   Wt 130 lb (59 kg)   SpO2 100%   BMI 23.03 kg/m   Physical Exam Awake and alert NAD but appears to be having some pain. Walking in room Pena examined, it is all intact and there was no evidence of leaking. The bulb was not compressed. ~20 mL bloody output  Imaging: [place] Abscess Drain  Result Date: 08/07/2016 INDICATION: 21 year old male with ruptured appendicitis and a right lower quadrant peritoneal abscess extending into the abdominal wall. EXAM: Ultrasound-guided drain placement MEDICATIONS: The patient is currently admitted to the hospital and receiving intravenous antibiotics. The antibiotics were administered within an appropriate time frame prior to the initiation of the procedure. ANESTHESIA/SEDATION:  Fentanyl 150 mcg IV; Versed 6 mg IV Moderate Sedation Time:  15 minutes The patient was continuously monitored during the procedure by the interventional radiology nurse under my direct supervision. COMPLICATIONS: None immediate. PROCEDURE: Informed written consent was obtained from the patient after a thorough discussion of the procedural risks, benefits and alternatives. All questions were addressed. A timeout was performed prior to the initiation of the procedure. The right lower quadrant was interrogated with ultrasound. A complex fluid collection was successfully identified just deep to the abdominal wall musculature. The skin entry site was marked. The region was sterilely prepped and draped in standard fashion using chlorhexidine skin prep. Local anesthesia was attained by infiltration with 1% lidocaine. Under real-time sonographic guidance, an 18 gauge trocar needle was advanced into the complex fluid collection. The stylet was removed. A 0.035 wire was advanced to the fluid collection. The needle was removed. The tract was dilated to 10 [place]. A Cook 10.2 [place] all-purpose drainage catheter was advanced over the wire and formed within the abscess cavity. Aspiration yields 25 mL thick, purulent material. The abscess cavity was lavaged once with sterile saline and the drainage catheter connected to JP bulb suction. The catheter was secured to the skin with 0 Prolene suture and a bandage. The patient tolerated the procedure well. IMPRESSION: Successful placement of a 10 French drainage catheter into the right lower quadrant appendiceal abscess. Aspiration yields 25 mL frankly purulent material. A sample was sent for Gram stain and culture. PLAN: Maintain tube to JP bulb suction until clinical symptoms and drain output  have resolved. Follow-up with surgery for discussion on interval appendectomy. Recommend repeat CT scan of the pelvis with intravenous contrast prior to drain removal. Electronically Signed    By: Malachy Moan M.D.   On: 08/07/2016 13:12   Ct Abdomen Pelvis W Contrast  Result Date: 08/07/2016 CLINICAL DATA:  Right lower quadrant pain with nausea vomiting EXAM: CT ABDOMEN AND PELVIS WITH CONTRAST TECHNIQUE: Multidetector CT imaging of the abdomen and pelvis was performed using the standard protocol following bolus administration of intravenous contrast. CONTRAST:  ISOVUE-300 IOPAMIDOL (ISOVUE-300) INJECTION 61% COMPARISON:  CT abdomen pelvis 07/26/2013 FINDINGS: Lower chest: No pulmonary nodules or pleural effusion. No visible pericardial effusion. Hepatobiliary: Normal hepatic contours and density. No visible biliary dilatation. Normal gallbladder. Pancreas: Normal contours without ductal dilatation. No peripancreatic fluid collection. Spleen: Normal. Adrenals/Urinary Tract: --Adrenal glands: Normal. --Right kidney/ureter: No hydronephrosis or perinephric stranding. No nephrolithiasis. No obstructing ureteral stones. --Left kidney/ureter: No hydronephrosis or perinephric stranding. No nephrolithiasis. No obstructing ureteral stones. --Urinary bladder: Unremarkable. Stomach/Bowel: --Stomach/Duodenum: No hiatal hernia or other gastric abnormality. Normal duodenal course and caliber. --Small bowel: No dilatation or inflammation. --Colon: No focal abnormality. --Appendix: The appendix itself is not clearly visualized. There is a peripherally enhancing intermediate density collection in the right lower quadrant that measures 3.6 x 4.6 x 4.8 cm (AP x Transverse x CC). This collection appears at least partially intramuscular within the right oblique abdominal muscles. No free fluid or air in the abdominal cavity. Vascular/Lymphatic: Normal course and caliber of the major abdominal vessels. No abdominal or pelvic lymphadenopathy. Reproductive: No free fluid in the pelvis. Musculoskeletal. No bony spinal canal stenosis or focal osseous abnormality. Other: None. IMPRESSION: Peripherally enhancing  intermediate density collection in the right lower quadrant, which appears to be located within the oblique abdominal musculature. 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 Another possibility is an intramuscular abscess secondary to a recent inflammatory GI process, such as a perforated appendix. I think this is unlikely, however, given the lack of inflammatory stranding and the absence of free fluid in the abdomen. Aspiration/histologic sampling is recommended. Electronically Signed   By: Deatra Robinson M.D.   On: 08/07/2016 06:14    Labs:  CBC:  Recent Labs  07/29/16 1024 08/07/16 0336 08/08/16 0343  WBC 6.0 11.4* 9.4  HGB 15.0 12.9* 11.8*  HCT 46.3 38.4* 36.1*  PLT 333 463* 378    COAGS:  Recent Labs  08/07/16 0914  INR 1.22    BMP:  Recent Labs  07/29/16 1024 08/07/16 0336 08/08/16 0343  NA 136 137 138  K 4.0 3.4* 3.9  CL 99* 100* 104  CO2 24 27 28   GLUCOSE 121* 130* 105*  BUN 10 8 <5*  CALCIUM 9.7 9.1 8.9  CREATININE 1.14 1.00 1.01  GFRNONAA >60 >60 >60  GFRAA >60 >60 >60    LIVER FUNCTION TESTS:  Recent Labs  07/29/16 1024 08/07/16 0336  BILITOT 0.6 0.9  AST 28 38  ALT 26 43  ALKPHOS 83 85  PROT 8.4* 8.1  ALBUMIN 4.0 3.1*    Assessment and Plan:  Abdominal abscess secondary to perforated appendix.  S/P drain placement by Dr. Archer Asa 7/21  No new labs today.  Continue Abx  Bulb compressed so drain is to suction.  Instructed patient to make sure it stays compressed unless it's being emptied.  Will follow.  Electronically Signed: Toniann Fail  S Khamiyah Grefe, PA-C 08/09/2016, 9:42 AM   I spent a total of 15 Minutes at the the patient's bedside AND on the patient's hospital floor or unit, greater than 50% of which was counseling/coordinating care for f/u abscess drain.

## 2016-08-09 NOTE — Plan of Care (Signed)
Problem: Nutrition: Goal: Adequate nutrition will be maintained Outcome: Not Progressing Pt still has some nausea when diet got advanced from clear liquids. Will continue to monitor.

## 2016-08-09 NOTE — Progress Notes (Signed)
Pt relaxed during remainder of shift and resting comfortably in bed with call light within reach. Will report off to oncoming RN. Dionne BucyP. Amo Davonne Jarnigan RN

## 2016-08-09 NOTE — Progress Notes (Signed)
Subjective/Chief Complaint: Pain improving. Says he has vomited with eating (not recorded). No fever last 24h.    Objective: Vital signs in last 24 hours: Temp:  [98.3 F (36.8 C)-98.7 F (37.1 C)] 98.5 F (36.9 C) (07/23 0544) Pulse Rate:  [70-91] 91 (07/23 0544) Resp:  [16-18] 18 (07/23 0544) BP: (119-157)/(75-81) 119/75 (07/23 0544) SpO2:  [98 %-100 %] 100 % (07/23 0544) Last BM Date:  (Prior to admission)  Intake/Output from previous day: 07/22 0701 - 07/23 0700 In: 15  Out: 20 [Drains:20] Drain output serosanguinous Intake/Output this shift: No intake/output data recorded.  General appearance: alert, cooperative and no distress Resp: breathing comfortably, regular rate GI: soft, non distended.  mild tenderness at drain site. Extremities: extremities normal, atraumatic, no cyanosis or edema GU - tiny RIH  Lab Results:   Recent Labs  08/07/16 0336 08/08/16 0343  WBC 11.4* 9.4  HGB 12.9* 11.8*  HCT 38.4* 36.1*  PLT 463* 378   BMET  Recent Labs  08/07/16 0336 08/08/16 0343  NA 137 138  K 3.4* 3.9  CL 100* 104  CO2 27 28  GLUCOSE 130* 105*  BUN 8 <5*  CREATININE 1.00 1.01  CALCIUM 9.1 8.9   PT/INR  Recent Labs  08/07/16 0914  LABPROT 15.5*  INR 1.22   ABG No results for input(s): PHART, HCO3 in the last 72 hours.  Invalid input(s): PCO2, PO2  Studies/Results: [place] Abscess Drain  Result Date: 08/07/2016 INDICATION: 21 year old male with ruptured appendicitis and a right lower quadrant peritoneal abscess extending into the abdominal wall. EXAM: Ultrasound-guided drain placement MEDICATIONS: The patient is currently admitted to the hospital and receiving intravenous antibiotics. The antibiotics were administered within an appropriate time frame prior to the initiation of the procedure. ANESTHESIA/SEDATION: Fentanyl 150 mcg IV; Versed 6 mg IV Moderate Sedation Time:  15 minutes The patient was continuously monitored during the procedure by the  interventional radiology nurse under my direct supervision. COMPLICATIONS: None immediate. PROCEDURE: Informed written consent was obtained from the patient after a thorough discussion of the procedural risks, benefits and alternatives. All questions were addressed. A timeout was performed prior to the initiation of the procedure. The right lower quadrant was interrogated with ultrasound. A complex fluid collection was successfully identified just deep to the abdominal wall musculature. The skin entry site was marked. The region was sterilely prepped and draped in standard fashion using chlorhexidine skin prep. Local anesthesia was attained by infiltration with 1% lidocaine. Under real-time sonographic guidance, an 18 gauge trocar needle was advanced into the complex fluid collection. The stylet was removed. A 0.035 wire was advanced to the fluid collection. The needle was removed. The tract was dilated to 10 [place]. A Cook 10.2 [place] all-purpose drainage catheter was advanced over the wire and formed within the abscess cavity. Aspiration yields 25 mL thick, purulent material. The abscess cavity was lavaged once with sterile saline and the drainage catheter connected to JP bulb suction. The catheter was secured to the skin with 0 Prolene suture and a bandage. The patient tolerated the procedure well. IMPRESSION: Successful placement of a 10 French drainage catheter into the right lower quadrant appendiceal abscess. Aspiration yields 25 mL frankly purulent material. A sample was sent for Gram stain and culture. PLAN: Maintain tube to JP bulb suction until clinical symptoms and drain output have resolved. Follow-up with surgery for discussion on interval appendectomy. Recommend repeat CT scan of the pelvis with intravenous contrast prior to drain removal. Electronically Signed  By: Malachy MoanHeath  McCullough M.D.   On: 08/07/2016 13:12    Anti-infectives: Anti-infectives    Start     Dose/Rate Route Frequency Ordered  Stop   08/07/16 1400  piperacillin-tazobactam (ZOSYN) IVPB 3.375 g     3.375 g 12.5 mL/hr over 240 Minutes Intravenous Every 8 hours 08/07/16 0805     08/07/16 0800  piperacillin-tazobactam (ZOSYN) IVPB 3.375 g  Status:  Discontinued     3.375 g 12.5 mL/hr over 240 Minutes Intravenous Every 8 hours 08/07/16 0758 08/07/16 0805   08/07/16 0600  piperacillin-tazobactam (ZOSYN) IVPB 3.375 g     3.375 g 100 mL/hr over 30 Minutes Intravenous  Once 08/07/16 0554 08/07/16 0643      Assessment/Plan:  Perforated appendicitis with abscess.  Continue regular diet for today. Monitor bowel function.  Repeat labs in AM. Likely home tomorrow.    LOS: 2 days    Berna BueChelsea A Connor 08/09/2016

## 2016-08-09 NOTE — Progress Notes (Signed)
Pt c/o not feeling good; VSS; pt in room crying stating he feels hot; MD paged and notified; MD ordered new orders along with to recheck VS in 30mins. Will closely monitor pt. Dionne BucyP. Amo Mahli Glahn RN

## 2016-08-09 NOTE — Progress Notes (Signed)
Pt poured a pitcher of water on himself making the drain dsg wet; new dsg applied with site unremarkable. Pt educated on keeping the drain site clean, dry and intact. Dionne BucyP. Amo Jakhi Dishman RN

## 2016-08-10 ENCOUNTER — Other Ambulatory Visit: Payer: Self-pay | Admitting: General Surgery

## 2016-08-10 DIAGNOSIS — K651 Peritoneal abscess: Secondary | ICD-10-CM

## 2016-08-10 LAB — MAGNESIUM: Magnesium: 2.2 mg/dL (ref 1.7–2.4)

## 2016-08-10 LAB — BASIC METABOLIC PANEL
Anion gap: 8 (ref 5–15)
BUN: 5 mg/dL — AB (ref 6–20)
CO2: 28 mmol/L (ref 22–32)
Calcium: 9.4 mg/dL (ref 8.9–10.3)
Chloride: 103 mmol/L (ref 101–111)
Creatinine, Ser: 1.13 mg/dL (ref 0.61–1.24)
Glucose, Bld: 92 mg/dL (ref 65–99)
POTASSIUM: 3.9 mmol/L (ref 3.5–5.1)
SODIUM: 139 mmol/L (ref 135–145)

## 2016-08-10 LAB — CBC
HEMATOCRIT: 37.7 % — AB (ref 39.0–52.0)
Hemoglobin: 12.4 g/dL — ABNORMAL LOW (ref 13.0–17.0)
MCH: 28.1 pg (ref 26.0–34.0)
MCHC: 32.9 g/dL (ref 30.0–36.0)
MCV: 85.3 fL (ref 78.0–100.0)
PLATELETS: 451 10*3/uL — AB (ref 150–400)
RBC: 4.42 MIL/uL (ref 4.22–5.81)
RDW: 12.2 % (ref 11.5–15.5)
WBC: 4.8 10*3/uL (ref 4.0–10.5)

## 2016-08-10 MED ORDER — ONDANSETRON 4 MG PO TBDP: 4.0000 mg | ORAL_TABLET | Freq: Three times a day (TID) | ORAL | 0 refills | Status: DC | PRN

## 2016-08-10 MED ORDER — PANTOPRAZOLE SODIUM 40 MG PO TBEC: 40.0000 mg | DELAYED_RELEASE_TABLET | Freq: Every day | ORAL | Status: DC

## 2016-08-10 MED ORDER — AMOXICILLIN-POT CLAVULANATE 875-125 MG PO TABS: 1.0000 | ORAL_TABLET | Freq: Two times a day (BID) | ORAL | 0 refills | Status: AC

## 2016-08-10 MED ORDER — AMOXICILLIN-POT CLAVULANATE 875-125 MG PO TABS
1.0000 | ORAL_TABLET | Freq: Two times a day (BID) | ORAL | Status: DC
  Administered 2016-08-10: 1 via ORAL
  Filled 2016-08-10: qty 1

## 2016-08-10 MED ORDER — OXYCODONE HCL 5 MG PO TABS: 5.0000 mg | ORAL_TABLET | Freq: Two times a day (BID) | ORAL | 0 refills | Status: DC | PRN

## 2016-08-10 MED ORDER — ACETAMINOPHEN 325 MG PO TABS: 650.0000 mg | ORAL_TABLET | Freq: Four times a day (QID) | ORAL | Status: DC | PRN

## 2016-08-10 MED FILL — AMOX-CLAV 875-125 MG TABLET: 875-125 | 7 days supply | Qty: 14 | Fill #0

## 2016-08-10 MED FILL — ?ONDANSETRON ODT 4 MG TABLE: 4 | 5 days supply | Qty: 15 | Fill #0

## 2016-08-10 NOTE — Social Work (Signed)
CSW contacted financial counselor, Shanda BumpsJessica, who advised that patient does not qualify for medicaid and she spoke with him yesterday to advise him.  She provided him information to get assistance with bills through local health dept. Per Shanda Bumpsjessica, pt does not qualify for emergency medicaid.  CSW advised nurse on floor and RNCM.  Richard BreathPatricia Eswin Worrell, LCSW Clinical Social Worker [phone]

## 2016-08-10 NOTE — Care Management Note (Signed)
Case Management Note  Patient Details  Name: Samul DadaDorrian D Wingate-Hayes MRN: [id number] Date of Birth: 08/16/1995  Subjective/Objective:    21 yr old young man admitted with a ruptured appendix.                 Action/Plan: Case manager has arranged for [facility] to [facility] [address], [place]. He has an appointment on Thursday, August 12, 2016 at 8:45am . [facility]. CM will explain this to patient.    Expected Discharge Date:  08/10/16               Expected Discharge Plan:  Home/Self Care  In-House Referral:  NA  Discharge planning Services  CM Consult, Indigent Health Clinic  Post Acute Care Choice:  NA Choice offered to:  NA  DME Arranged:  N/A DME Agency:  NA  HH Arranged:  NA HH Agency:  NA  Status of Service:  Completed, signed off  If discussed at Long Length of Stay Meetings, dates discussed:    Additional Comments:  Durenda GuthrieBrady, Danali Marinos Naomi, RN 08/10/2016, 11:53 AM

## 2016-08-10 NOTE — Discharge Instructions (Signed)
Flush drain with 5cc of normal saline daily.  Record output daily and bring this with you to your follow up appointment.  PAIN: Take TYLENOL 500-650 mg every 6 hours as needed Take IBUPROFEN 400 mg every 8 hours as needed If the above medicines do not work, you may take oxycodone 5-10 mg up to 2 times daily.   FOLLOW UP: The radiology clinic will call you for follow up regarding drain removal. Call the [facility] surgery office and schedule an appointment with Dr. Janee Mornhompson   Appendicitis The appendix is a tube that is shaped like a finger. It is connected to the large intestine. Appendicitis means that this tube is swollen (inflamed). Without treatment, the tube can tear (rupture). This can lead to a life-threatening infection. It can also cause you to have sores (abscesses). These sores hurt. What are the causes? This condition may be caused by something that blocks the appendix, such as:  A ball of poop (stool).  Lymph glands that are bigger than normal.  Sometimes, the cause is not known. What are the signs or symptoms? Symptoms of this condition include:  Pain around the belly button (navel). ? The pain moves toward the lower right belly (abdomen). ? The pain can get worse with time. ? The pain can get worse if you cough. ? The pain can get worse if you move suddenly.  Tenderness in the lower right belly.  Feeling sick to your stomach (nauseous).  Throwing up (vomiting).  Not feeling hungry (loss of appetite).  A fever.  Having a hard time pooping (constipation).  Watery poop (diarrhea).  Not feeling well.  How is this treated? Usually, this condition is treated by taking out the appendix (appendectomy). There are two ways that the appendix can be taken out:  Open surgery. In this surgery, the appendix is taken out through a large cut (incision). The cut is made in the lower right belly. This surgery may be picked if: ? You have scars from another surgery. ? You have a  bleeding condition. ? You are pregnant and will be having your baby soon. ? You have a condition that does not allow the other type of surgery.  Laparoscopic surgery. In this surgery, the appendix is taken out through small cuts. Often, this surgery: ? Causes less pain. ? Causes fewer problems. ? Is easier to heal from.  If your appendix tears and a sore forms:  A drain may be put into the sore. The drain will be used to get rid of fluid.  You may get an antibiotic medicine through an IV tube.  Your appendix may or may not need to be taken out.  This information is not intended to replace advice given to you by your health care provider. Make sure you discuss any questions you have with your health care provider. Document Released: 03/29/2011 Document Revised: 06/12/2015 Document Reviewed: 05/22/2014 Elsevier Interactive Patient Education  2018 [employer]   Percutaneous Abscess Drain, Care After This sheet gives you information about how to care for yourself after your procedure. Your health care provider may also give you more specific instructions. If you have problems or questions, contact your health care provider. What can I expect after the procedure? After your procedure, it is common to have:  A small amount of bruising and discomfort in the area where the drainage tube (catheter) was placed.  Sleepiness and fatigue. This should go away after the medicines you were given have worn off.  Follow  these instructions at home:  Check your incision area every day for signs of infection. Check for: ? More redness, swelling, or pain. ? More fluid or blood. ? Warmth. ? Pus or a bad smell. ? Fluid leaking from around your catheter (instead of fluid draining through your catheter). Catheter care  Follow instructions from your health care provider about emptying and cleaning your catheter and collection bag. You may need to clean the catheter every day so it does not  clog.  If directed, write down the following information every time you empty your bag: ? The date and time. ? The amount of drainage. General instructions  Rest at home for 1-2 days after your procedure. Return to your normal activities as told by your health care provider.  Do not take baths, swim, or use a hot tub for 24 hours after your procedure, or until your health care provider says that this is okay.  Take over-the-counter and prescription medicines only as told by your health care provider.  Keep all follow-up visits as told by your health care provider. This is important. Contact a health care provider if:  You have less than 10 mL of drainage a day for 2-3 days in a row, or as directed by your health care provider.  You have more redness, swelling, or pain around your incision area.  You have more fluid or blood coming from your incision area.  Your incision area feels warm to the touch.  You have pus or a bad smell coming from your incision area.  You have fluid leaking from around your catheter (instead of through your catheter).  You have a fever or chills.  You have pain that does not get better with medicine. Get help right away if:  Your catheter comes out.  You suddenly stop having drainage from your catheter.  You suddenly have blood in the fluid that is draining from your catheter.  You become dizzy or you faint.  You develop a rash.  You have nausea or vomiting.  You have difficulty breathing or you feel short of breath.  You develop chest pain.  You have problems with your speech or vision.  You have trouble balancing or moving your arms or legs. Summary  It is common to have a small amount of bruising and discomfort in the area where the drainage tube (catheter) was placed.  You may be directed to record the amount of drainage from the bag every time you empty it.  Follow instructions from your health care provider about emptying and  cleaning your catheter and collection bag. This information is not intended to replace advice given to you by your health care provider. Make sure you discuss any questions you have with your health care provider. Document Released: 05/21/2013 Document Revised: 11/27/2015 Document Reviewed: 11/27/2015 Elsevier Interactive Patient Education  2017 [employer].

## 2016-08-10 NOTE — Progress Notes (Signed)
[facility] Progress Note     Subjective: CC:  Abdominal pain improving. Tolerating PO without nausea/vomiting. Having loose BMs, non-bloody. Wants to go home.  Afebrile, VSS, no vomiting recorded overnight Objective: Vital signs in last 24 hours: Temp:  [97.8 F (36.6 C)-98.7 F (37.1 C)] 97.8 F (36.6 C) (07/23 2120) Pulse Rate:  [62-82] 62 (07/23 2120) Resp:  [17-18] 18 (07/23 2120) BP: (100-133)/(42-94) 100/42 (07/23 2120) SpO2:  [96 %-100 %] 100 % (07/23 2120) Last BM Date:  (Prior to admission)  Intake/Output from previous day: 07/23 0701 - 07/24 0700 In: 781 [P.O.:716; IV Piggyback:50] Out: 11 [Drains:11] Intake/Output this shift: Total I/O In: 120 [P.O.:120] Out: -   PE: Gen:  Alert, NAD, pleasant  Card:  Regular rate and rhythm, pedal pulses 2+ BL Pulm:  Normal effort, clear to auscultation bilaterally Abd: Soft, mild tenderness around drain site, non-distended, bowel sounds present in all 4 quadrants  Drain: 11 cc/ 24h Skin: warm and dry, no rashes  Psych: A&Ox3   Lab Results:   Recent Labs  08/08/16 0343 08/10/16 0405  WBC 9.4 4.8  HGB 11.8* 12.4*  HCT 36.1* 37.7*  PLT 378 451*   BMET  Recent Labs  08/08/16 0343 08/10/16 0405  NA 138 139  K 3.9 3.9  CL 104 103  CO2 28 28  GLUCOSE 105* 92  BUN <5* 5*  CREATININE 1.01 1.13  CALCIUM 8.9 9.4   PT/INR No results for input(s): LABPROT, INR in the last 72 hours. CMP     Component Value Date/Time   NA 139 08/10/2016 0405   K 3.9 08/10/2016 0405   CL 103 08/10/2016 0405   CO2 28 08/10/2016 0405   GLUCOSE 92 08/10/2016 0405   BUN 5 (L) 08/10/2016 0405   CREATININE 1.13 08/10/2016 0405   CALCIUM 9.4 08/10/2016 0405   PROT 8.1 08/07/2016 0336   ALBUMIN 3.1 (L) 08/07/2016 0336   AST 38 08/07/2016 0336   ALT 43 08/07/2016 0336   ALKPHOS 85 08/07/2016 0336   BILITOT 0.9 08/07/2016 0336   GFRNONAA >60 08/10/2016 0405   GFRAA >60 08/10/2016 0405   Lipase     Component  Value Date/Time   LIPASE 22 08/07/2016 0336       Studies/Results: Dg Abd 1 View  Result Date: 08/09/2016 CLINICAL DATA:  Perforated appendicitis [id number], x 4 days with/nausea/vomiting, RLQ pain EXAM: ABDOMEN - 1 VIEW COMPARISON:  CT 08/07/2016 FINDINGS: Right lower quadrant pigtail drain catheter. Normal bowel gas pattern. No abnormal abdominal calcifications. Regional bones unremarkable. IMPRESSION: 1. Normal bowel gas pattern. 2. Right lower quadrant pigtail drain catheter in anticipated location. Electronically Signed   By: Corlis Leak  Hassell M.D.   On: 08/09/2016 16:13    Anti-infectives: Anti-infectives    Start     Dose/Rate Route Frequency Ordered Stop   08/07/16 1400  piperacillin-tazobactam (ZOSYN) IVPB 3.375 g     3.375 g 12.5 mL/hr over 240 Minutes Intravenous Every 8 hours 08/07/16 0805     08/07/16 0800  piperacillin-tazobactam (ZOSYN) IVPB 3.375 g  Status:  Discontinued     3.375 g 12.5 mL/hr over 240 Minutes Intravenous Every 8 hours 08/07/16 0758 08/07/16 0805   08/07/16 0600  piperacillin-tazobactam (ZOSYN) IVPB 3.375 g     3.375 g 100 mL/hr over 30 Minutes Intravenous  Once 08/07/16 0554 08/07/16 0643       Assessment/Plan Perforated appendicitis with abscess - S/P drain placement Dr. Archer AsaMcCullough (IR) 7/21 - Afebrile, leukocytosis resolved; hemodynamically stable -  tolerating PO, having BM's - consult to case management for [facility] needs - stable for discharge home with [facility] for drain care; OP follow up with IR and Dr. Violeta Gelinas  FEN: regular diet, electrolytes WNL ID: Zosyn 7/21-7/24 (Day #4), transition to PO Augmentin  VTE: Lovenox  I have personally reviewed the patients medication history on the [place] controlled substance database. I personally called patients mother and updated her on plan of care, at request to patient and nurse.    LOS: 3 days    Adam Phenix , [facility] Surgery 08/10/2016, 9:49 AM Pager: [phone] Consults:  [phone] Mon-Fri 7:00 am-4:30 pm Sat-Sun 7:00 am-11:30 am

## 2016-08-10 NOTE — Discharge Summary (Signed)
[facility] Discharge Summary   Patient ID: Richard Ortiz MRN: [id number] DOB/AGE: [birth year] 21 y.o.  Admit date: 08/07/2016 Discharge date: 08/10/2016  Discharge Diagnosis Patient Active Problem List   Diagnosis Date Noted  . Ruptured appendicitis 08/07/2016    Consultants Interventional Radiology - Dr. Malachy MoanHeath McCullough  Case Management   Imaging: Dg Abd 1 View  Result Date: 08/09/2016 CLINICAL DATA:  Perforated appendicitis [id number], x 4 days with/nausea/vomiting, RLQ pain EXAM: ABDOMEN - 1 VIEW COMPARISON:  CT 08/07/2016 FINDINGS: Right lower quadrant pigtail drain catheter. Normal bowel gas pattern. No abnormal abdominal calcifications. Regional bones unremarkable. IMPRESSION: 1. Normal bowel gas pattern. 2. Right lower quadrant pigtail drain catheter in anticipated location. Electronically Signed   By: Corlis Leak  Hassell M.D.   On: 08/09/2016 16:13    Procedures 08/07/16 - Dr. Archer AsaMcCullough - US guided placement of a 50F drain into the RLQ appendiceal abscess  Hospital Course:  Richard Ortiz is a 21 year old African-American male who presented to [facility] with a 9 day history of right lower quadrant pain. Workup significant for leukocytosis and a CT scan consistent with ruptured appendicitis with right lower quadrant abscess. The patient was admitted, started on IV antibiotics, and interventional radiology was consulted. They performed the above procedure to drain appendiceal abscess. The patient was continued on IV antibiotics, diet advanced as tolerated, and on 08/10/16 the patient's pain was controlled, afebrile, leukocytosis resolved, and medically stable for discharge. The patient was uninsured and did not qualify for Medicaid/home health care, however the case manager arranged for the patient to [facility] and he'll be able to receive prescriptions from [facility]. He'll require outpatient  follow-up with interventional radiology drain clinic as well as in [facility] surgical office with Dr. Violeta GelinasBurke Thompson to discuss interval appendectomy.   Allergies as of 08/10/2016   No Known Allergies    Allergies as of 08/10/2016   No Known Allergies     Medication List    STOP taking these medications   promethazine 25 MG tablet Commonly known as:  PHENERGAN     TAKE these medications   acetaminophen 325 MG tablet Commonly known as:  TYLENOL Take 2 tablets (650 mg total) by mouth every 6 (six) hours as needed for mild pain, fever or headache (or temp > 100).   amoxicillin-clavulanate 875-125 MG tablet Commonly known as:  AUGMENTIN Take 1 tablet by mouth every 12 (twelve) hours.   ibuprofen 800 MG tablet Commonly known as:  ADVIL,MOTRIN Take 1 tablet (800 mg total) by mouth 3 (three) times daily. What changed:  when to take this  reasons to take this   ondansetron 4 MG disintegrating tablet Commonly known as:  ZOFRAN-ODT Take 1 tablet (4 mg total) by mouth every 8 (eight) hours as needed for nausea or vomiting.   oxyCODONE 5 MG immediate release tablet Commonly known as:  Oxy IR/ROXICODONE Take 1-2 tablets (5-10 mg total) by mouth every 12 (twelve) hours as needed for moderate pain.   polyethylene glycol packet Commonly known as:  MIRALAX / GLYCOLAX Take 17 g by mouth daily as needed for mild constipation or moderate constipation.        Follow-up Information    Violeta Gelinashompson, [place], MD. Schedule an appointment as soon as possible for a visit in 4 week(s).   Specialty:  General Surgery Contact information: [address] [place] [address] [phone]  Malachy Moan, MD Follow up in 1 week(s).   Specialties:  Interventional Radiology, Radiology Why:  our office will call you with appointment date and time Contact information: [address] [place] [address] [phone]        [facility]. Go  to.   Specialty:  Family Medicine Why:  Your appointment on Thursday, August 12, 2016 at 8:45am with Franki Cabot Contact information: [address] [address] [phone]        [facility]. Go to.   Why:  [facility] to get [employer] prescriptions filled.  Contact information: [address] [place] [address] [phone]          Signed: Hosie Spangle, [facility] 08/10/2016, 2:23 PM Pager: [phone] Consults: [phone] Mon-Fri 7:00 am-4:30 pm Sat-Sun 7:00 am-11:30 am

## 2016-08-10 NOTE — Progress Notes (Signed)
Pt ready for discharge. Educated pt and pt's girlfriend on how to flush JP drain, change dressing, and record output. Pt verbalized understanding and demonstrated proper technique.  Also enforced: importance of taking oral antibiotics to completion, S/S of infection, and importance of keeping follow up appointments. All questions and concerns addressed. IV removed and belongings gathered. Pt declined wheelchair out. Will be transported home via girlfriend's vehicle.

## 2016-08-11 LAB — HIV ANTIBODY (ROUTINE TESTING W REFLEX): HIV SCREEN 4TH GENERATION: NONREACTIVE

## 2016-08-12 ENCOUNTER — Encounter (INDEPENDENT_AMBULATORY_CARE_PROVIDER_SITE_OTHER): Payer: Self-pay | Admitting: Physician Assistant

## 2016-08-12 ENCOUNTER — Ambulatory Visit (INDEPENDENT_AMBULATORY_CARE_PROVIDER_SITE_OTHER): Payer: Medicaid Other | Admitting: Physician Assistant

## 2016-08-12 VITALS — BP 115/71 | HR 67 | Temp 97.8°F | Wt 143.6 lb

## 2016-08-12 DIAGNOSIS — R110 Nausea: Secondary | ICD-10-CM

## 2016-08-12 DIAGNOSIS — Z09 Encounter for follow-up examination after completed treatment for conditions other than malignant neoplasm: Secondary | ICD-10-CM

## 2016-08-12 DIAGNOSIS — K352 Acute appendicitis with generalized peritonitis: Secondary | ICD-10-CM

## 2016-08-12 DIAGNOSIS — K3532 Acute appendicitis with perforation and localized peritonitis, without abscess: Secondary | ICD-10-CM

## 2016-08-12 LAB — AEROBIC/ANAEROBIC CULTURE W GRAM STAIN (SURGICAL/DEEP WOUND)

## 2016-08-12 LAB — AEROBIC/ANAEROBIC CULTURE (SURGICAL/DEEP WOUND)

## 2016-08-12 MED ORDER — ONDANSETRON 4 MG PO TBDP: 4.0000 mg | ORAL_TABLET | Freq: Three times a day (TID) | ORAL | 0 refills | Status: DC | PRN

## 2016-08-12 NOTE — Progress Notes (Signed)
   Subjective:  Patient ID: Richard Ortiz, male    DOB: Dec 18, 1995  Age: 21 y.o. MRN: [id number]  CC: hospital f/u   HPI Richard Ortiz is a 21 y.o. male with a PMH of asthma presents on hospital f/u. Admitted from 08/07/16 to 08/10/16 for rutpured appendicitis. Has a drain tube in and annotated 12.5 mL of drainage 08/10/16. Reports feeling generally well except for mild nausea without vomiting yesterday. Still has loose stools but there is no blood. He is eating normally again and able to hold down food. Denies CP, palpitations, SOB, HA, abdominal pain, rash, or GI sxs. He is returning to surgeon in three weeks to have drain removed.    ROS Review of Systems  Constitutional: Negative for chills, fever and malaise/fatigue.  Eyes: Negative for blurred vision.  Respiratory: Negative for shortness of breath.   Cardiovascular: Negative for chest pain and palpitations.  Gastrointestinal: Negative for abdominal pain and nausea.  Genitourinary: Negative for dysuria and hematuria.  Musculoskeletal: Negative for joint pain and myalgias.  Skin: Negative for rash.  Neurological: Negative for tingling and headaches.  Psychiatric/Behavioral: Negative for depression. The patient is not nervous/anxious.     Objective:  BP 115/71 (BP Location: Left Arm, Patient Position: Sitting, Cuff Size: Normal)   Pulse 67   Temp 97.8 F (36.6 C) (Oral)   Wt 143 lb 9.6 oz (65.1 kg)   SpO2 98%   BMI 25.44 kg/m   BP/Weight 08/12/2016 08/09/2016 08/07/2016  Systolic BP 115 100 -  Diastolic BP 71 42 -  Wt. (Lbs) 143.6 - 130  BMI 25.44 - 23.03      Physical Exam  Constitutional: He is oriented to person, place, and time.  Well developed, well nourished, NAD, polite, drain tube in place  HENT:  Head: Normocephalic and atraumatic.  Eyes: No scleral icterus.  Cardiovascular: Normal rate, regular rhythm and normal heart sounds.   Pulmonary/Chest: Effort normal and breath sounds normal.   Abdominal: Soft. Bowel sounds are normal. There is no tenderness.  Musculoskeletal: He exhibits no edema.  Neurological: He is alert and oriented to person, place, and time. No cranial nerve deficit. Coordination normal.  Skin: Skin is warm and dry. No rash noted. No erythema. No pallor.  No sign of infection around drain tube os at abdomen  Psychiatric: He has a normal mood and affect. His behavior is normal. Thought content normal.  Vitals reviewed.    Assessment & Plan:   1. Hospital discharge follow-up - CBC with Differential - Basic Metabolic Panel  2. Ruptured appendicitis - CBC with Differential - Basic Metabolic Panel  3. Nausea without vomiting - Refill ondansetron (ZOFRAN-ODT) 4 MG disintegrating tablet; Take 1 tablet (4 mg total) by mouth every 8 (eight) hours as needed for nausea or vomiting.  Dispense: 15 tablet; Refill: 0   Meds ordered this encounter  Medications  . ondansetron (ZOFRAN-ODT) 4 MG disintegrating tablet    Sig: Take 1 tablet (4 mg total) by mouth every 8 (eight) hours as needed for nausea or vomiting.    Dispense:  15 tablet    Refill:  0    Order Specific Question:   Supervising Provider    Answer:   Quentin AngstJEGEDE, OLUGBEMIGA E [id number]]    Follow-up: Return in about 4 weeks (around 09/09/2016).   Richard Specteroger David Gomez PA

## 2016-08-13 LAB — CBC WITH DIFFERENTIAL/PLATELET
Basophils Absolute: 0 10*3/uL (ref 0.0–0.2)
Basos: 1 %
EOS (ABSOLUTE): 0.4 10*3/uL (ref 0.0–0.4)
EOS: 8 %
HEMATOCRIT: 41.2 % (ref 37.5–51.0)
Hemoglobin: 13.3 g/dL (ref 13.0–17.7)
IMMATURE GRANULOCYTES: 0 %
Immature Grans (Abs): 0 10*3/uL (ref 0.0–0.1)
LYMPHS ABS: 1.9 10*3/uL (ref 0.7–3.1)
Lymphs: 39 %
MCH: 27.9 pg (ref 26.6–33.0)
MCHC: 32.3 g/dL (ref 31.5–35.7)
MCV: 87 fL (ref 79–97)
MONOS ABS: 0.2 10*3/uL (ref 0.1–0.9)
Monocytes: 4 %
NEUTROS PCT: 48 %
Neutrophils Absolute: 2.3 10*3/uL (ref 1.4–7.0)
PLATELETS: 551 10*3/uL — AB (ref 150–379)
RBC: 4.76 x10E6/uL (ref 4.14–5.80)
RDW: 13.7 % (ref 12.3–15.4)
WBC: 4.8 10*3/uL (ref 3.4–10.8)

## 2016-08-13 LAB — BASIC METABOLIC PANEL
BUN / CREAT RATIO: 9 (ref 9–20)
BUN: 8 mg/dL (ref 6–20)
CO2: 24 mmol/L (ref 20–29)
Calcium: 9.5 mg/dL (ref 8.7–10.2)
Chloride: 102 mmol/L (ref 96–106)
Creatinine, Ser: 0.93 mg/dL (ref 0.76–1.27)
GFR calc Af Amer: 134 mL/min/{1.73_m2} (ref >59)
GFR, EST NON AFRICAN AMERICAN: 116 mL/min/{1.73_m2} (ref >59)
Glucose: 74 mg/dL (ref 65–99)
POTASSIUM: 4.5 mmol/L (ref 3.5–5.2)
SODIUM: 141 mmol/L (ref 134–144)

## 2016-08-19 ENCOUNTER — Ambulatory Visit
Admission: RE | Admit: 2016-08-19 | Discharge: 2016-08-19 | Disposition: A | Discharge status: Home / Self Care | Payer: Medicaid Other | Source: Ambulatory Visit | Attending: General Surgery | Admitting: General Surgery

## 2016-08-19 DIAGNOSIS — K651 Peritoneal abscess: Secondary | ICD-10-CM

## 2016-08-19 HISTORY — PX: IR RADIOLOGIST EVAL & MGMT: IMG5224

## 2016-08-19 IMAGING — RF DG SINUS / FISTULA TRACT / ABSCESSOGRAM
3 series · 8 of 8 positions shown · non-contrast
Comparison: none

INDICATION: Follow-up right lower quadrant abdominal abscess. Abscess collection
has resolved on recent CT. Minimal drainage according to the
patient.

[Series 1: one shot · 1 of 1 slices shown (1 of 2)]
[im 1/1]
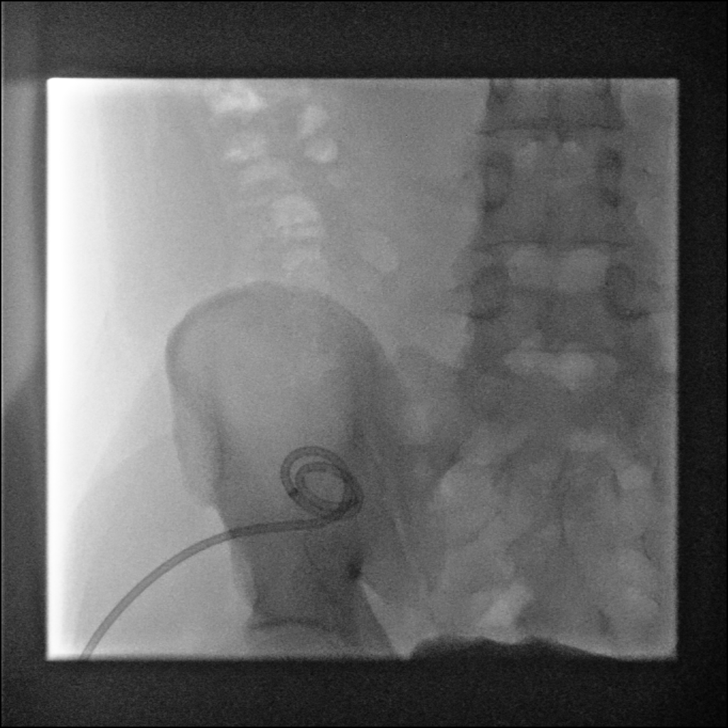

[Series 2: sequence · 4 of 29 frames shown]
[frame 5/29]
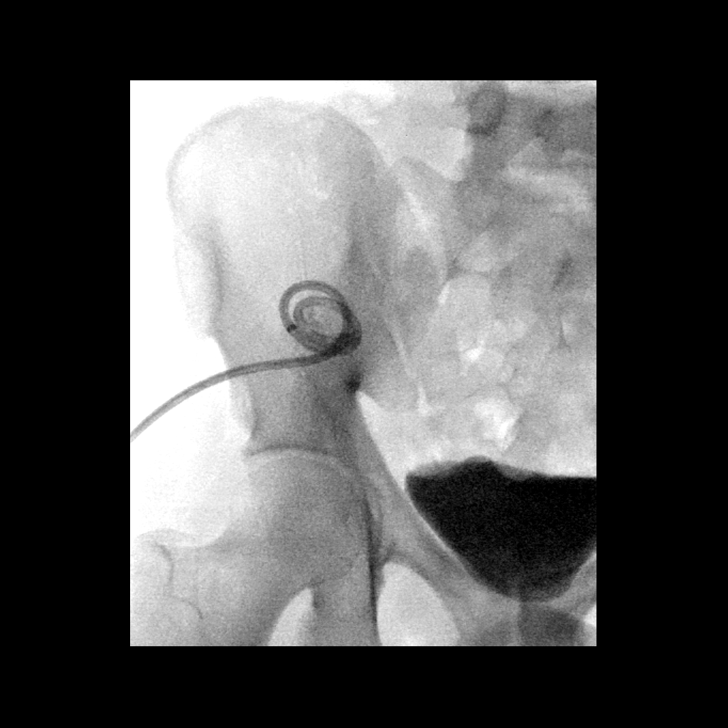
[frame 15/29]
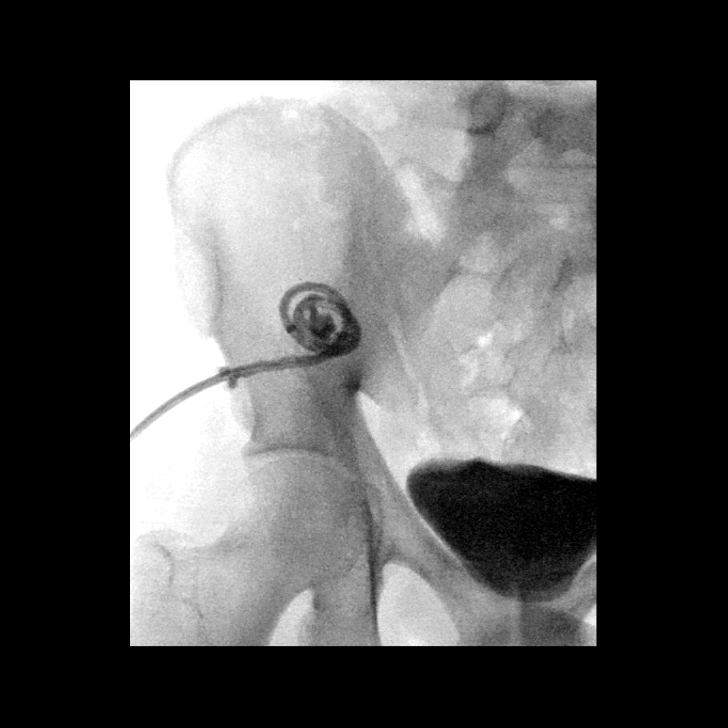
[frame 19/29]
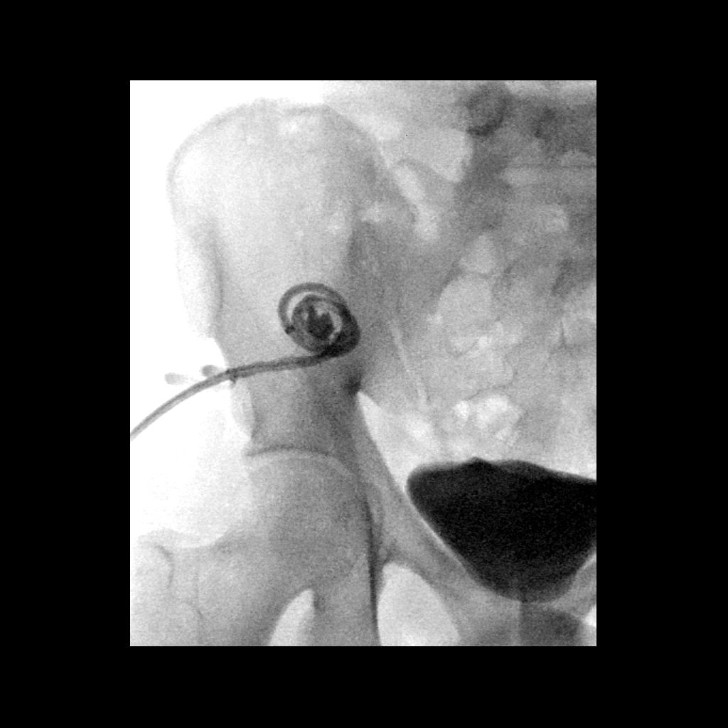
[frame 25/29]
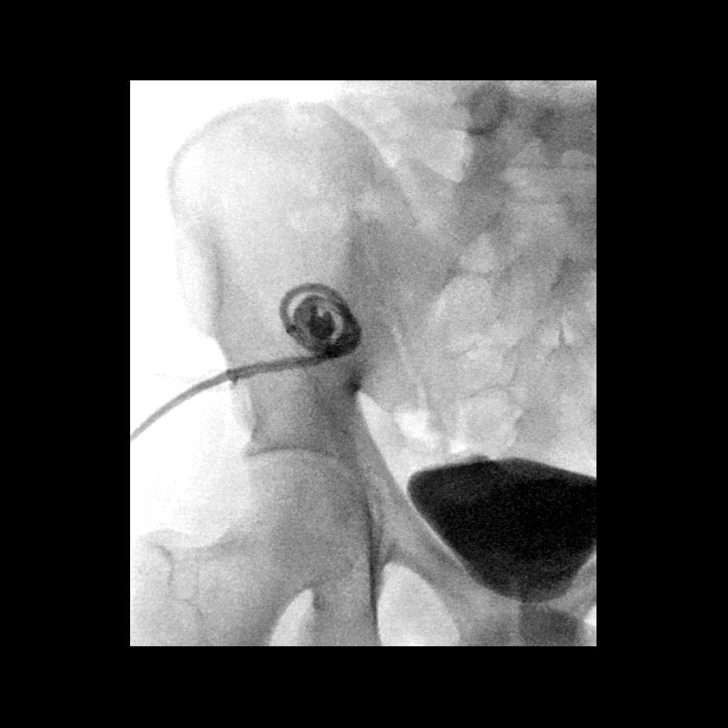

[Series 3: one shot · 3 of 3 slices shown (2 of 2)]
[im 1/3]
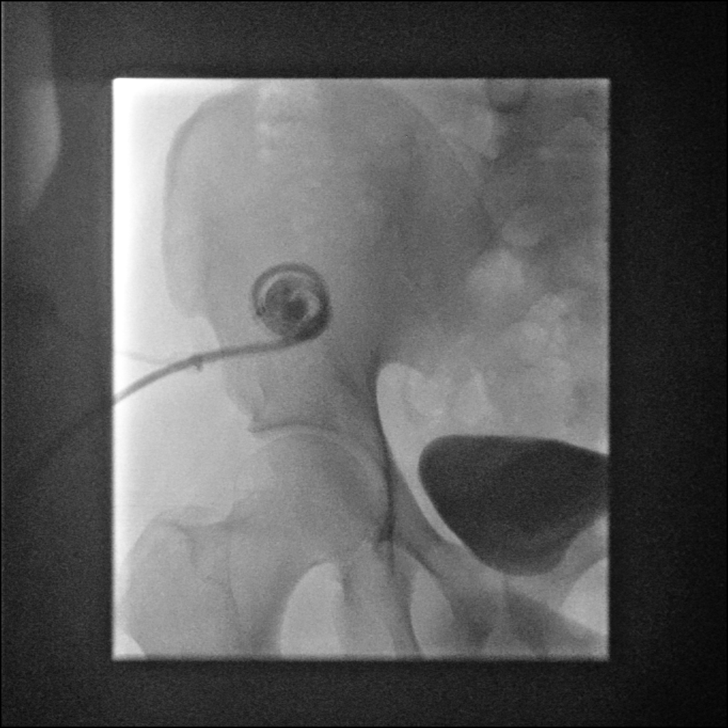
[im 2/3]
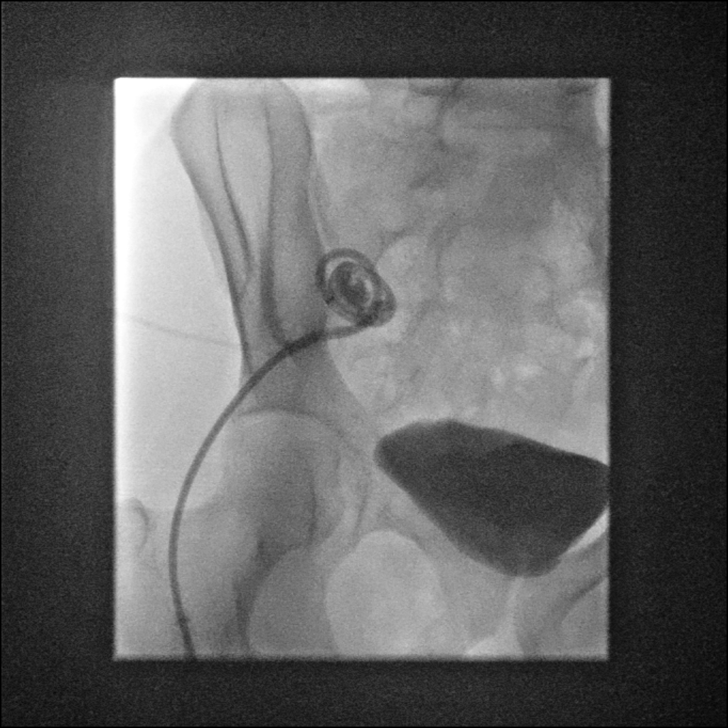
[im 3/3]
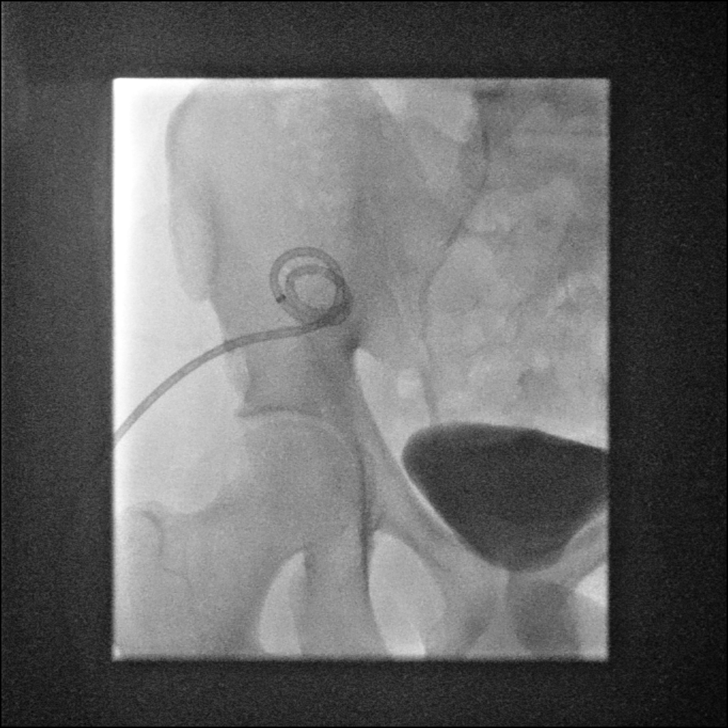

[8 of 8 positions shown; findings below may reference images not displayed]

EXAM:
DRAIN INJECTION WITH FLUOROSCOPY

MEDICATIONS:
Patient is currently on antibiotics.

ANESTHESIA/SEDATION:
None

FLUOROSCOPY TIME:  30 seconds, 10 mGy

COMPLICATIONS:
None immediate.

PROCEDURE:
Patient was placed supine on the fluoroscopic table. Scout image of
the right lower quadrant was obtained. 8 mL Omnipaque 300 was
injected through the catheter under fluoroscopic guidance. Contrast
was aspirated at the end of the procedure.

The catheter was cut and completely removed without complication.
Bandage placed at the drain exit site.
FINDINGS: Catheter is coiled in the right lower quadrant. There is contrast
within the urinary bladder related to recent CT. Small amount of
contrast fills within the coiled catheter but there is no
significant abscess collection and there is no bowel fistula.
Contrast is actually refluxing along the catheter onto skin surface.
IMPRESSION: No residual abscess collection.  No evidence for a bowel fistula.

Abscess drain catheter was completely removed.

## 2016-08-19 MED ORDER — IOPAMIDOL (ISOVUE-300) INJECTION 61%
100.0000 mL | Freq: Once | INTRAVENOUS | Status: AC | PRN
  Administered 2016-08-19: 100 mL via INTRAVENOUS

## 2016-08-19 NOTE — Consult Note (Signed)
Chief Complaint: Patient was seen in consultation today for follow-up of the right lower quadrant abscess at the request of Osborne,Kelly  Referring Physician(s): Violeta Gelinas  History of Present Illness: Richard Ortiz is a 21 y.o. male with recent history of a right lower abdominal abscess. Abscess is likely related to a ruptured appendicitis. Patient has been doing very well since his hospital discharge. He denies fevers or chills. He feels much better since he has been on antibiotics and since the drain has been placed. Patient reports minimal output from the drain. Small amount of serosanguineous fluid has been in the suction bulb. He notes that saline is leaking around the catheter when he flushes it. Patient is still taking his antibiotics. Follow-up CT scan of abdomen and pelvis was performed today.  Past Medical History:  Diagnosis Date  . Abdominal hernia   . Asthma     No past surgical history on file.  Allergies: Patient has no known allergies.  Medications: Prior to Admission medications   Medication Sig Start Date End Date Taking? Authorizing Provider  acetaminophen (TYLENOL) 325 MG tablet Take 2 tablets (650 mg total) by mouth every 6 (six) hours as needed for mild pain, fever or headache (or temp > 100). 08/10/16   Simaan, Francine Graven, PA-C  ibuprofen (ADVIL,MOTRIN) 800 MG tablet Take 1 tablet (800 mg total) by mouth 3 (three) times daily. Patient not taking: Reported on 08/12/2016 02/10/15   Melton Krebs, PA-C  ondansetron (ZOFRAN-ODT) 4 MG disintegrating tablet Take 1 tablet (4 mg total) by mouth every 8 (eight) hours as needed for nausea or vomiting. 08/12/16   Loletta Specter, PA-C  oxyCODONE (OXY IR/ROXICODONE) 5 MG immediate release tablet Take 1-2 tablets (5-10 mg total) by mouth every 12 (twelve) hours as needed for moderate pain. 08/10/16   Serayah Yazdani Phenix, PA-C  polyethylene glycol (MIRALAX / GLYCOLAX) packet Take 17 g by mouth  daily as needed for mild constipation or moderate constipation. Patient not taking: Reported on 08/12/2016 07/26/13   Muthersbaugh, Dahlia Client, PA-C     Family History  Problem Relation Age of Onset  . Hypertension Maternal Grandmother     Social History   Social History  . Marital status: Single    Spouse name: N/A  . Number of children: N/A  . Years of education: N/A   Social History Main Topics  . Smoking status: Never Smoker  . Smokeless tobacco: Never Used  . Alcohol use Yes  . Drug use: Yes    Types: Marijuana     Comment: 2-3 times a week  . Sexual activity: No   Other Topics Concern  . Not on file   Social History Narrative   ** Merged History Encounter **          Review of Systems  Constitutional: Negative for chills and fever.  Gastrointestinal: Negative for abdominal pain.    Vital Signs: BP 121/79 (BP Location: Right Arm)   Pulse 76   Temp 98.5 F (36.9 C) (Oral)   Physical Exam  Constitutional: He appears well-developed and well-nourished.  Pulmonary/Chest: No respiratory distress.  Abdominal: Soft. He exhibits no distension. There is no tenderness.  Drainage catheter in the right lower quadrant is intact. No erythema or discharge around the catheter. Small amount of serosanguineous fluid within the suction bulb.       Imaging: Dg Abd 1 View  Result Date: 08/09/2016 CLINICAL DATA:  Perforated appendicitis 161096, x 4 days with/nausea/vomiting, RLQ pain  EXAM: ABDOMEN - 1 VIEW COMPARISON:  CT 08/07/2016 FINDINGS: Right lower quadrant pigtail drain catheter. Normal bowel gas pattern. No abnormal abdominal calcifications. Regional bones unremarkable. IMPRESSION: 1. Normal bowel gas pattern. 2. Right lower quadrant pigtail drain catheter in anticipated location. Electronically Signed   By: Corlis Leak  Hassell M.D.   On: 08/09/2016 16:13   [place] Abscess Drain  Result Date: 08/07/2016 INDICATION: 21 year old male with ruptured appendicitis and a right lower quadrant  peritoneal abscess extending into the abdominal wall. EXAM: Ultrasound-guided drain placement MEDICATIONS: The patient is currently admitted to the hospital and receiving intravenous antibiotics. The antibiotics were administered within an appropriate time frame prior to the initiation of the procedure. ANESTHESIA/SEDATION: Fentanyl 150 mcg IV; Versed 6 mg IV Moderate Sedation Time:  15 minutes The patient was continuously monitored during the procedure by the interventional radiology nurse under my direct supervision. COMPLICATIONS: None immediate. PROCEDURE: Informed written consent was obtained from the patient after a thorough discussion of the procedural risks, benefits and alternatives. All questions were addressed. A timeout was performed prior to the initiation of the procedure. The right lower quadrant was interrogated with ultrasound. A complex fluid collection was successfully identified just deep to the abdominal wall musculature. The skin entry site was marked. The region was sterilely prepped and draped in standard fashion using chlorhexidine skin prep. Local anesthesia was attained by infiltration with 1% lidocaine. Under real-time sonographic guidance, an 18 gauge trocar needle was advanced into the complex fluid collection. The stylet was removed. A 0.035 wire was advanced to the fluid collection. The needle was removed. The tract was dilated to 10 [place]. A Cook 10.2 [place] all-purpose drainage catheter was advanced over the wire and formed within the abscess cavity. Aspiration yields 25 mL thick, purulent material. The abscess cavity was lavaged once with sterile saline and the drainage catheter connected to JP bulb suction. The catheter was secured to the skin with 0 Prolene suture and a bandage. The patient tolerated the procedure well. IMPRESSION: Successful placement of a 10 French drainage catheter into the right lower quadrant appendiceal abscess. Aspiration yields 25 mL frankly purulent  material. A sample was sent for Gram stain and culture. PLAN: Maintain tube to JP bulb suction until clinical symptoms and drain output have resolved. Follow-up with surgery for discussion on interval appendectomy. Recommend repeat CT scan of the pelvis with intravenous contrast prior to drain removal. Electronically Signed   By: Malachy MoanHeath  McCullough M.D.   On: 08/07/2016 13:12   Ct Abdomen Pelvis W Contrast  Result Date: 08/19/2016 CLINICAL DATA:  21 year old with history of right lower quadrant abscess, possibly ruptured appendix. Drain placement on 08/07/2016. Patient presents for follow-up imaging. EXAM: CT ABDOMEN AND PELVIS WITH CONTRAST TECHNIQUE: Multidetector CT imaging of the abdomen and pelvis was performed using the standard protocol following bolus administration of intravenous contrast. CONTRAST:  100mL ISOVUE-300 IOPAMIDOL (ISOVUE-300) INJECTION 61% COMPARISON:  CT 08/07/2016 FINDINGS: Lower chest: Lung bases are clear. Hepatobiliary: Normal appearance of the liver, gallbladder and portal venous system. No biliary dilatation. Pancreas: Normal appearance of the pancreas without inflammation or duct dilatation. Spleen: Normal appearance of spleen without enlargement. Adrenals/Urinary Tract: Normal adrenal glands. Normal appearance of kidneys without hydronephrosis. Urinary bladder is unremarkable. Stomach/Bowel: No evidence for small or large bowel dilatation. No evidence for a bowel obstruction. Normal appearance of the stomach. The abscess in the right lower quadrant involving the right abdominal musculature has resolved following placement of the drainage catheter. The drainage catheter remains in place.  Mild inflammatory changes adjacent to the cecum and right colon. Appendix is not confidently identified. Vascular/Lymphatic: No significant vascular findings are present. No enlarged abdominal or pelvic lymph nodes. Reproductive: Prostate is normal. Other: No abdominal free fluid. The right lower  quadrant abscess has resolved following placement of the drainage catheter. Drainage catheter is coiled in the right lower quadrant of the abdomen and musculature. There is residual but decreased edema around the catheter. No new fluid or abscess collections. Musculoskeletal: No acute abnormality. IMPRESSION: Right lower quadrant abdominal abscess has resolved following placement of a drainage catheter. Decreased edema surrounding the catheter. Appendix is not confidently identified. Electronically Signed   By: Richarda Overlie M.D.   On: 08/19/2016 09:36   Ct Abdomen Pelvis W Contrast  Result Date: 08/07/2016 CLINICAL DATA:  Right lower quadrant pain with nausea vomiting EXAM: CT ABDOMEN AND PELVIS WITH CONTRAST TECHNIQUE: Multidetector CT imaging of the abdomen and pelvis was performed using the standard protocol following bolus administration of intravenous contrast. CONTRAST:  ISOVUE-300 IOPAMIDOL (ISOVUE-300) INJECTION 61% COMPARISON:  CT abdomen pelvis 07/26/2013 FINDINGS: Lower chest: No pulmonary nodules or pleural effusion. No visible pericardial effusion. Hepatobiliary: Normal hepatic contours and density. No visible biliary dilatation. Normal gallbladder. Pancreas: Normal contours without ductal dilatation. No peripancreatic fluid collection. Spleen: Normal. Adrenals/Urinary Tract: --Adrenal glands: Normal. --Right kidney/ureter: No hydronephrosis or perinephric stranding. No nephrolithiasis. No obstructing ureteral stones. --Left kidney/ureter: No hydronephrosis or perinephric stranding. No nephrolithiasis. No obstructing ureteral stones. --Urinary bladder: Unremarkable. Stomach/Bowel: --Stomach/Duodenum: No hiatal hernia or other gastric abnormality. Normal duodenal course and caliber. --Small bowel: No dilatation or inflammation. --Colon: No focal abnormality. --Appendix: The appendix itself is not clearly visualized. There is a peripherally enhancing intermediate density collection in the right  lower quadrant that measures 3.6 x 4.6 x 4.8 cm (AP x Transverse x CC). This collection appears at least partially intramuscular within the right oblique abdominal muscles. No free fluid or air in the abdominal cavity. Vascular/Lymphatic: Normal course and caliber of the major abdominal vessels. No abdominal or pelvic lymphadenopathy. Reproductive: No free fluid in the pelvis. Musculoskeletal. No bony spinal canal stenosis or focal osseous abnormality. Other: None. IMPRESSION: Peripherally enhancing intermediate density collection in the right lower quadrant, which appears to be located within the oblique abdominal musculature. 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 Another possibility is an intramuscular abscess secondary to a recent inflammatory GI process, such as a perforated appendix. I think this is unlikely, however, given the lack of inflammatory stranding and the absence of free fluid in the abdomen. Aspiration/histologic sampling is recommended. Electronically Signed   By: Deatra Robinson M.D.   On: 08/07/2016 06:14   Dg Sinus/fist Tube Chk-non Gi  Result Date: 08/19/2016 INDICATION: Follow-up right lower quadrant abdominal abscess. Abscess collection has resolved on recent CT. Minimal drainage according to the patient. EXAM: DRAIN INJECTION WITH FLUOROSCOPY MEDICATIONS: Patient is currently on antibiotics. ANESTHESIA/SEDATION: None FLUOROSCOPY TIME:  30 seconds, 10 mGy COMPLICATIONS: None immediate. PROCEDURE: Patient was placed supine on the fluoroscopic table. Scout image of the right lower quadrant was obtained. 8 mL Omnipaque 300 was injected through the catheter under fluoroscopic guidance. Contrast was aspirated at the end of the procedure. The catheter was cut and completely removed without complication. Bandage placed at the drain exit site. FINDINGS: Catheter is coiled in the  right lower quadrant. There is contrast within the  urinary bladder related to recent CT. Small amount of contrast fills within the coiled catheter but there is no significant abscess collection and there is no bowel fistula. Contrast is actually refluxing along the catheter onto skin surface. IMPRESSION: No residual abscess collection.  No evidence for a bowel fistula. Abscess drain catheter was completely removed. Electronically Signed   By: Richarda OverlieAdam  Amere Iott M.D.   On: 08/19/2016 11:19    Labs:  CBC:  Recent Labs  08/07/16 0336 08/08/16 0343 08/10/16 0405 08/12/16 0926  WBC 11.4* 9.4 4.8 4.8  HGB 12.9* 11.8* 12.4* 13.3  HCT 38.4* 36.1* 37.7* 41.2  PLT 463* 378 451* 551*    COAGS:  Recent Labs  08/07/16 0914  INR 1.22    BMP:  Recent Labs  08/07/16 0336 08/08/16 0343 08/10/16 0405 08/12/16 0926  NA 137 138 139 141  K 3.4* 3.9 3.9 4.5  CL 100* 104 103 102  CO2 27 28 28 24   GLUCOSE 130* 105* 92 74  BUN 8 <5* 5* 8  CALCIUM 9.1 8.9 9.4 9.5  CREATININE 1.00 1.01 1.13 0.93  GFRNONAA >60 >60 >60 116  GFRAA >60 >60 >60 134    LIVER FUNCTION TESTS:  Recent Labs  07/29/16 1024 08/07/16 0336  BILITOT 0.6 0.9  AST 28 38  ALT 26 43  ALKPHOS 83 85  PROT 8.4* 8.1  ALBUMIN 4.0 3.1*    TUMOR MARKERS: No results for input(s): AFPTM, CEA, CA199, CHROMGRNA in the last 8760 hours.  Assessment and Plan:  21 year old male with history of a right lower quadrant abdominal abscess, possibly related to a rupture appendicitis. The abscess has resolved based on CT and drain injection. No evidence for a bowel fistula. The drain was completely removed without complication. Recommend that the patient complete his antibiotic course. He is scheduled to follow up with surgery.  Thank you for this interesting consult.  I greatly enjoyed meeting Richard Ortiz and look forward to participating in their care.  A copy of this report was sent to the requesting provider on this  date.  Electronically Signed: Abundio MiuHENN, Sherrika Weakland RYAN 08/19/2016, 11:23 AM   I spent a total of    10 Minutes in face to face in clinical consultation, greater than 50% of which was counseling/coordinating care for drain management.

## 2016-09-09 ENCOUNTER — Ambulatory Visit (INDEPENDENT_AMBULATORY_CARE_PROVIDER_SITE_OTHER): Payer: Self-pay | Admitting: Physician Assistant

## 2016-11-01 ENCOUNTER — Encounter: Payer: Self-pay | Admitting: Diagnostic Radiology

## 2017-10-26 ENCOUNTER — Encounter (HOSPITAL_COMMUNITY): Payer: Self-pay

## 2017-10-26 ENCOUNTER — Telehealth (HOSPITAL_COMMUNITY): Payer: Self-pay

## 2017-10-26 ENCOUNTER — Ambulatory Visit (HOSPITAL_COMMUNITY)
Admission: EM | Admit: 2017-10-26 | Discharge: 2017-10-26 | Disposition: A | Discharge status: Home / Self Care | Payer: Self-pay | Attending: Family Medicine | Admitting: Family Medicine

## 2017-10-26 DIAGNOSIS — Z113 Encounter for screening for infections with a predominantly sexual mode of transmission: Secondary | ICD-10-CM

## 2017-10-26 DIAGNOSIS — Z202 Contact with and (suspected) exposure to infections with a predominantly sexual mode of transmission: Secondary | ICD-10-CM | POA: Insufficient documentation

## 2017-10-26 MED ORDER — METRONIDAZOLE 500 MG PO TABS: 500.0000 mg | ORAL_TABLET | Freq: Two times a day (BID) | ORAL | 0 refills | Status: DC

## 2017-10-26 MED ORDER — LIDOCAINE HCL (PF) 1 % IJ SOLN
INTRAMUSCULAR | Status: AC
  Filled 2017-10-26: qty 2

## 2017-10-26 MED ORDER — CEFTRIAXONE SODIUM 250 MG IJ SOLR
250.0000 mg | Freq: Once | INTRAMUSCULAR | Status: AC
Start: 2017-10-26 — End: 2017-10-26
  Administered 2017-10-26: 250 mg via INTRAMUSCULAR

## 2017-10-26 MED ORDER — AZITHROMYCIN 250 MG PO TABS
1000.0000 mg | ORAL_TABLET | Freq: Once | ORAL | Status: AC
  Administered 2017-10-26: 1000 mg via ORAL

## 2017-10-26 MED ORDER — CEFTRIAXONE SODIUM 250 MG IJ SOLR
INTRAMUSCULAR | Status: AC
  Filled 2017-10-26: qty 250

## 2017-10-26 MED ORDER — AZITHROMYCIN 250 MG PO TABS
ORAL_TABLET | ORAL | Status: AC
  Filled 2017-10-26: qty 4

## 2017-10-26 NOTE — Discharge Instructions (Signed)

## 2017-10-26 NOTE — ED Provider Notes (Signed)
[facility]   [id number] 10/26/17 Arrival Time: 1736  ASSESSMENT & PLAN:  1. STD exposure    Meds ordered this encounter  Medications  . cefTRIAXone (ROCEPHIN) injection 250 mg  . azithromycin (ZITHROMAX) tablet 1,000 mg  . metroNIDAZOLE (FLAGYL) 500 MG tablet    Sig: Take 1 tablet (500 mg total) by mouth 2 (two) times daily.    Dispense:  14 tablet    Refill:  0     Discharge Instructions     You have been given the following medications today for treatment of suspected gonorrhea and/or chlamydia:  cefTRIAXone (ROCEPHIN) injection 250 mg azithromycin (ZITHROMAX) tablet 1,000 mg  Even though we have treated you today, we have sent testing for sexually transmitted infections. We will notify you of any positive results once they are received. If required, we will prescribe any medications you might need.  Please refrain from all sexual activity for at least the next seven days.    Pending: Labs Reviewed  URINE CYTOLOGY ANCILLARY ONLY   Will notify of any positive results. Instructed to refrain from sexual activity for at least seven days.  Reviewed expectations re: course of current medical issues. Questions answered. Outlined signs and symptoms indicating need for more acute intervention. Patient verbalized understanding. After Visit Summary given.   SUBJECTIVE:  Richard Ortiz is a 22 y.o. male who reports exposure to STD. Thinks partner tested + for trich/GC/Chlamydia. No penile discharge. Urinary symptoms: none. Afebrile. No abdominal or pelvic pain. No n/v. No rashes or lesions. Sexually active with single male partner. OTC treatment: none. History of similar symptoms: No.  ROS: As per HPI.  OBJECTIVE:  Vitals:   10/26/17 1834  BP: (!) 152/87  Pulse: 87  Resp: 16  Temp: 98.4 F (36.9 C)  TempSrc: Oral  SpO2: 99%    General appearance: alert, cooperative, appears stated age and no distress Throat: lips, mucosa, and tongue  normal; teeth and gums normal Back: no CVA tenderness Abdomen: soft, non-tender GU: deferred Skin: warm and dry Psychological: alert and cooperative. Normal mood and affect.   Labs Reviewed  URINE CYTOLOGY ANCILLARY ONLY    No Known Allergies  Past Medical History:  Diagnosis Date  . Abdominal hernia   . Asthma    Family History  Problem Relation Age of Onset  . Hypertension Maternal Grandmother    Social History   Socioeconomic History  . Marital status: Single    Spouse name: Not on file  . Number of children: Not on file  . Years of education: Not on file  . Highest education level: Not on file  Occupational History  . Not on file  Social Needs  . Financial resource strain: Not on file  . Food insecurity:    Worry: Not on file    Inability: Not on file  . Transportation needs:    Medical: Not on file    Non-medical: Not on file  Tobacco Use  . Smoking status: Never Smoker  . Smokeless tobacco: Never Used  Substance and Sexual Activity  . Alcohol use: Yes  . Drug use: Yes    Types: Marijuana    Comment: 2-3 times a week  . Sexual activity: Never  Lifestyle  . Physical activity:    Days per week: Not on file    Minutes per session: Not on file  . Stress: Not on file  Relationships  . Social connections:    Talks on phone: Not on file  Gets together: Not on file    Attends religious service: Not on file    Active member of club or organization: Not on file    Attends meetings of clubs or organizations: Not on file    Relationship status: Not on file  . Intimate partner violence:    Fear of current or ex partner: Not on file    Emotionally abused: Not on file    Physically abused: Not on file    Forced sexual activity: Not on file  Other Topics Concern  . Not on file  Social History Narrative   ** Merged History Encounter Mardella Layman, MD 10/26/17 [phone]

## 2017-10-26 NOTE — ED Triage Notes (Signed)
Pt has a hernia that has not been repaired yet

## 2017-10-26 NOTE — ED Triage Notes (Signed)
Pt presents with generalized abdominal pain. 

## 2017-10-27 LAB — URINE CYTOLOGY ANCILLARY ONLY
Chlamydia: NEGATIVE
Neisseria Gonorrhea: NEGATIVE
Trichomonas: NEGATIVE

## 2018-08-11 ENCOUNTER — Other Ambulatory Visit: Payer: Self-pay

## 2018-08-11 ENCOUNTER — Emergency Department (HOSPITAL_COMMUNITY): Admission: EM | Admit: 2018-08-11 | Discharge: 2018-08-11 | Discharge status: Absent without leave | Payer: Self-pay

## 2018-08-12 ENCOUNTER — Ambulatory Visit (HOSPITAL_COMMUNITY)
Admission: EM | Admit: 2018-08-12 | Discharge: 2018-08-12 | Disposition: A | Discharge status: Home / Self Care | Payer: Self-pay | Attending: Family Medicine | Admitting: Family Medicine

## 2018-08-12 ENCOUNTER — Encounter (HOSPITAL_COMMUNITY): Payer: Self-pay

## 2018-08-12 ENCOUNTER — Other Ambulatory Visit: Payer: Self-pay

## 2018-08-12 DIAGNOSIS — L739 Follicular disorder, unspecified: Secondary | ICD-10-CM

## 2018-08-12 MED ORDER — MUPIROCIN 2 % EX OINT: 1.0000 "application " | TOPICAL_OINTMENT | Freq: Three times a day (TID) | CUTANEOUS | 1 refills | Status: DC

## 2018-08-12 NOTE — ED Triage Notes (Signed)
Pt states he has facial irritation. X 3 day.
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
## 2018-08-24 ENCOUNTER — Ambulatory Visit (HOSPITAL_COMMUNITY)
Admission: EM | Admit: 2018-08-24 | Discharge: 2018-08-24 | Disposition: A | Discharge status: Home / Self Care | Payer: Self-pay | Attending: Physician Assistant | Admitting: Physician Assistant

## 2018-08-24 ENCOUNTER — Encounter (HOSPITAL_COMMUNITY): Payer: Self-pay

## 2018-08-24 ENCOUNTER — Other Ambulatory Visit: Payer: Self-pay

## 2018-08-24 ENCOUNTER — Telehealth (HOSPITAL_COMMUNITY): Payer: Self-pay | Admitting: Urgent Care

## 2018-08-24 DIAGNOSIS — L0100 Impetigo, unspecified: Secondary | ICD-10-CM

## 2018-08-24 MED ORDER — SULFAMETHOXAZOLE-TRIMETHOPRIM 800-160 MG PO TABS: 1.0000 | ORAL_TABLET | Freq: Two times a day (BID) | ORAL | 0 refills | Status: DC

## 2018-08-24 NOTE — ED Triage Notes (Signed)
Pt states he has a rash on his right side on and down his right side. This has been going on for a week.

## 2018-08-24 NOTE — Telephone Encounter (Signed)
PT would like meds sent to [facility] [place]

## 2018-08-24 NOTE — Discharge Instructions (Signed)
Return if any problems.

## 2018-08-24 NOTE — ED Provider Notes (Addendum)
[facility]    CSN: [id number] Arrival date & time: 08/24/18  1752      History   Chief Complaint Chief Complaint  Patient presents with  . Rash    HPI Richard Ortiz is a 23 y.o. male.   The history is provided by the patient. No language interpreter was used.  Rash Location:  Full body Quality: redness and swelling   Severity:  Moderate Onset quality:  Gradual Timing:  Constant Context: exposure to similar rash   Relieved by:  Nothing Worsened by:  Nothing Ineffective treatments:  None tried Patient reports his little cousin had a similar rash and was diagnosed with impetigo.  Patient reports he was holding the patient against his face for rash began  Past Medical History:  Diagnosis Date  . Abdominal hernia   . Asthma     Patient Active Problem List   Diagnosis Date Noted  . Ruptured appendicitis 08/07/2016  . SIRS (systemic inflammatory response syndrome) (HCC) 03/25/2012  . Facial cellulitis 03/25/2012  . Dental abscess 03/25/2012    Past Surgical History:  Procedure Laterality Date  . IR RADIOLOGIST EVAL & MGMT  08/19/2016       Home Medications    Prior to Admission medications   Medication Sig Start Date End Date Taking? Authorizing Provider  acetaminophen (TYLENOL) 325 MG tablet Take 2 tablets (650 mg total) by mouth every 6 (six) hours as needed for mild pain, fever or headache (or temp > 100). 08/10/16   Simaan, Francine GravenElizabeth S, PA-C  ibuprofen (ADVIL,MOTRIN) 800 MG tablet Take 1 tablet (800 mg total) by mouth 3 (three) times daily. Patient not taking: Reported on 08/12/2016 02/10/15   Melton Krebsiley, Samantha Nicole, PA-C  metroNIDAZOLE (FLAGYL) 500 MG tablet Take 1 tablet (500 mg total) by mouth 2 (two) times daily. 10/26/17   Mardella LaymanHagler, Brian, MD  mupirocin ointment (BACTROBAN) 2 % Apply 1 application topically 3 (three) times daily. 08/12/18   Bing NeighborsHarris, Kimberly S, FNP  ondansetron (ZOFRAN-ODT) 4 MG disintegrating tablet Take 1 tablet (4 mg  total) by mouth every 8 (eight) hours as needed for nausea or vomiting. 08/12/16   Loletta SpecterGomez, Roger David, PA-C  oxyCODONE (OXY IR/ROXICODONE) 5 MG immediate release tablet Take 1-2 tablets (5-10 mg total) by mouth every 12 (twelve) hours as needed for moderate pain. 08/10/16   Adam PhenixSimaan, Elizabeth S, PA-C  polyethylene glycol (MIRALAX / GLYCOLAX) packet Take 17 g by mouth daily as needed for mild constipation or moderate constipation. Patient not taking: Reported on 08/12/2016 07/26/13   Muthersbaugh, Dahlia ClientHannah, PA-C  sulfamethoxazole-trimethoprim (BACTRIM DS) 800-160 MG tablet Take 1 tablet by mouth 2 (two) times daily. 08/24/18   Elson AreasSofia, Caleyah Jr K, PA-C    Family History Family History  Problem Relation Age of Onset  . Hypertension Maternal Grandmother     Social History Social History   Tobacco Use  . Smoking status: Never Smoker  . Smokeless tobacco: Never Used  Substance Use Topics  . Alcohol use: Yes  . Drug use: Yes    Types: Marijuana    Comment: 2-3 times a week     Allergies   Patient has no known allergies.   Review of Systems Review of Systems  Skin: Positive for rash.  All other systems reviewed and are negative.    Physical Exam Triage Vital Signs ED Triage Vitals  Enc Vitals Group     BP 08/24/18 1829 101/69     Pulse Rate 08/24/18 1829 76  Resp 08/24/18 1829 16     Temp 08/24/18 1829 98.2 F (36.8 C)     Temp Source 08/24/18 1829 Oral     SpO2 08/24/18 1829 98 %     Weight 08/24/18 1827 170 lb (77.1 kg)     Height --      Head Circumference --      Peak Flow --      Pain Score 08/24/18 1827 8     Pain Loc --      Pain Edu? --      Excl. in [place]? --    No data found.  Updated Vital Signs BP 101/69 (BP Location: Right Arm)   Pulse 76   Temp 98.2 F (36.8 C) (Oral)   Resp 16   Wt 77.1 kg   SpO2 98%   BMI 30.11 kg/m   Visual Acuity Right Eye Distance:   Left Eye Distance:   Bilateral Distance:    Right Eye Near:   Left Eye Near:    Bilateral  Near:     Physical Exam Vitals signs reviewed.  HENT:     Head: Normocephalic.  Cardiovascular:     Rate and Rhythm: Normal rate.  Pulmonary:     Effort: Pulmonary effort is normal.  Skin:    Findings: Erythema and rash present.     Comments: Multiple erythematous areas,  Honey combed crusted areas under right arm, right leg and abdomen  Neurological:     General: No focal deficit present.     Mental Status: He is alert.  Psychiatric:        Mood and Affect: Mood normal.      UC Treatments / Results  Labs (all labs ordered are listed, but only abnormal results are displayed) Labs Reviewed - No data to display  EKG   Radiology No results found.  Procedures Procedures (including critical care time)  Medications Ordered in UC Medications - No data to display  Initial Impression / Assessment and Plan / UC Course  I have reviewed the triage vital signs and the nursing notes.  Pertinent labs & imaging results that were available during my care of the patient were reviewed by me and considered in my medical decision making (see chart for details).     Rash could be impetigo possibly MRsa.  Pt given Rx for antibiotic.  Final Clinical Impressions(s) / UC Diagnoses   Final diagnoses:  Impetigo     Discharge Instructions     Return if any problems   ED Prescriptions    Medication Sig Dispense Auth. Provider   sulfamethoxazole-trimethoprim (BACTRIM DS) 800-160 MG tablet Take 1 tablet by mouth 2 (two) times daily. 20 tablet Fransico Meadow, [place]     Controlled Substance Prescriptions [place] Controlled Substance Registry consulted?    Fransico Meadow, PA-C 08/24/18 1918    Fransico Meadow, [place] 08/24/18 1918

## 2018-10-23 ENCOUNTER — Ambulatory Visit (HOSPITAL_COMMUNITY)
Admission: EM | Admit: 2018-10-23 | Discharge: 2018-10-23 | Disposition: A | Discharge status: Home / Self Care | Payer: Self-pay | Attending: Family Medicine | Admitting: Family Medicine

## 2018-10-23 ENCOUNTER — Other Ambulatory Visit: Payer: Self-pay

## 2018-10-23 ENCOUNTER — Encounter (HOSPITAL_COMMUNITY): Payer: Self-pay

## 2018-10-23 DIAGNOSIS — Z202 Contact with and (suspected) exposure to infections with a predominantly sexual mode of transmission: Secondary | ICD-10-CM | POA: Insufficient documentation

## 2018-10-23 MED ORDER — CEFTRIAXONE SODIUM 250 MG IJ SOLR
INTRAMUSCULAR | Status: AC
  Filled 2018-10-23: qty 250

## 2018-10-23 MED ORDER — AZITHROMYCIN 250 MG PO TABS
1000.0000 mg | ORAL_TABLET | Freq: Once | ORAL | Status: AC
  Administered 2018-10-23: 12:00:00 1000 mg via ORAL

## 2018-10-23 MED ORDER — AZITHROMYCIN 250 MG PO TABS
ORAL_TABLET | ORAL | Status: AC
  Filled 2018-10-23: qty 1

## 2018-10-23 MED ORDER — CEFTRIAXONE SODIUM 250 MG IJ SOLR
250.0000 mg | Freq: Once | INTRAMUSCULAR | Status: AC
  Administered 2018-10-23: 12:00:00 250 mg via INTRAMUSCULAR

## 2018-10-23 NOTE — ED Triage Notes (Signed)
Pt states he wants to get tested for STD's.

## 2018-10-23 NOTE — Discharge Instructions (Signed)
Log on to My Chart to get test results as soon as they are available We will call any positive tests

## 2018-10-23 NOTE — ED Provider Notes (Signed)
[place]    CSN: [id number] Arrival date & time: 10/23/18  1027      History   Chief Complaint Chief Complaint  Patient presents with  . SEXUALLY TRANSMITTED DISEASE    HPI Richard Ortiz is a 23 y.o. male.   HPI  Patient is here because his "thing" hurts.  He states that his girlfriend told her that she was not treated for STDs last time they both had symptoms.  He came in here and received treatment.  His cultures were negative.  He expresses displeasure that he was not called with his negative cultures.  I told him that we never call negative test results, that he needs to call here he has a question about his test results.  Even better, he should sign up for my chart and the results will come directly to him. He denies any discharge.  Denies any rash.  Uncertain what he is exposed to.  He thinks it was "gonorrhea or chlamydia or trichomonas".  Past Medical History:  Diagnosis Date  . Abdominal hernia   . Asthma     Patient Active Problem List   Diagnosis Date Noted  . Ruptured appendicitis 08/07/2016  . SIRS (systemic inflammatory response syndrome) ([place]) 03/25/2012  . Facial cellulitis 03/25/2012  . Dental abscess 03/25/2012    Past Surgical History:  Procedure Laterality Date  . IR RADIOLOGIST EVAL & MGMT  08/19/2016       Home Medications    Prior to Admission medications   Not on File    Family History Family History  Problem Relation Age of Onset  . Hypertension Maternal Grandmother     Social History Social History   Tobacco Use  . Smoking status: Never Smoker  . Smokeless tobacco: Never Used  Substance Use Topics  . Alcohol use: Yes  . Drug use: Yes    Types: Marijuana    Comment: 2-3 times a week     Allergies   Patient has no known allergies.   Review of Systems Review of Systems  Constitutional: Negative for chills and fever.  HENT: Negative for ear pain and sore throat.   Eyes: Negative for pain and  visual disturbance.  Respiratory: Negative for cough and shortness of breath.   Cardiovascular: Negative for chest pain and palpitations.  Gastrointestinal: Negative for abdominal pain and vomiting.  Genitourinary: Positive for penile pain. Negative for discharge, dysuria and hematuria.  Musculoskeletal: Negative for arthralgias and back pain.  Skin: Negative for color change and rash.  Neurological: Negative for seizures and syncope.  All other systems reviewed and are negative.    Physical Exam Triage Vital Signs ED Triage Vitals  Enc Vitals Group     BP 10/23/18 1105 (!) 157/70     Pulse Rate 10/23/18 1105 98     Resp 10/23/18 1105 16     Temp --      Temp Source 10/23/18 1105 Oral     SpO2 10/23/18 1105 100 %     Weight 10/23/18 1104 170 lb (77.1 kg)     Height --      Head Circumference --      Peak Flow --      Pain Score --      Pain Loc --      Pain Edu? --      Excl. in [place]? --    No data found.  Updated Vital Signs BP (!) 157/70 (BP Location: Right Arm)  Pulse 98   Resp 16   Wt 77.1 kg   SpO2 100%   BMI 30.11 kg/m      Physical Exam Constitutional:      General: He is not in acute distress.    Appearance: He is well-developed.  HENT:     Head: Normocephalic and atraumatic.  Eyes:     Conjunctiva/sclera: Conjunctivae normal.     Pupils: Pupils are equal, round, and reactive to light.  Neck:     Musculoskeletal: Normal range of motion.  Cardiovascular:     Rate and Rhythm: Normal rate.  Pulmonary:     Effort: Pulmonary effort is normal. No respiratory distress.  Abdominal:     General: There is no distension.     Palpations: Abdomen is soft.  Genitourinary:    Penis: Normal.   Musculoskeletal: Normal range of motion.  Skin:    General: Skin is warm and dry.  Neurological:     Mental Status: He is alert.  Psychiatric:        Mood and Affect: Mood normal.        Behavior: Behavior normal.      UC Treatments / Results  Labs (all labs  ordered are listed, but only abnormal results are displayed) Labs Reviewed  CYTOLOGY, (ORAL, ANAL, URETHRAL) ANCILLARY ONLY    EKG   Radiology No results found.  Procedures Procedures (including critical care time)  Medications Ordered in UC Medications  azithromycin (ZITHROMAX) tablet 1,000 mg (1,000 mg Oral Given 10/23/18 1159)  cefTRIAXone (ROCEPHIN) injection 250 mg (250 mg Intramuscular Given 10/23/18 1200)  azithromycin (ZITHROMAX) 250 MG tablet (has no administration in time range)  cefTRIAXone (ROCEPHIN) 250 MG injection (has no administration in time range)    Initial Impression / Assessment and Plan / UC Course  I have reviewed the triage vital signs and the nursing notes.  Pertinent labs & imaging results that were available during my care of the patient were reviewed by me and considered in my medical decision making (see chart for details).    Patient requests treatment.  Swab was sent for testing.  Safe sex is recommended.  Final Clinical Impressions(s) / UC Diagnoses   Final diagnoses:  Possible exposure to STD     Discharge Instructions     Log on to My Chart to get test results as soon as they are available We will call any positive tests   ED Prescriptions    None     PDMP not reviewed this encounter.   Eustace Moore, MD 10/23/18 1225

## 2018-10-24 LAB — CYTOLOGY, (ORAL, ANAL, URETHRAL) ANCILLARY ONLY
Chlamydia: NEGATIVE
Neisseria Gonorrhea: NEGATIVE
Trichomonas: NEGATIVE

## 2019-07-12 ENCOUNTER — Encounter (HOSPITAL_COMMUNITY): Payer: Self-pay | Admitting: *Deleted

## 2019-07-12 ENCOUNTER — Emergency Department (HOSPITAL_COMMUNITY)
Admission: EM | Admit: 2019-07-12 | Discharge: 2019-07-12 | Disposition: A | Discharge status: Home / Self Care | Payer: Self-pay | Attending: Emergency Medicine | Admitting: Emergency Medicine

## 2019-07-12 DIAGNOSIS — N3000 Acute cystitis without hematuria: Secondary | ICD-10-CM | POA: Insufficient documentation

## 2019-07-12 DIAGNOSIS — Z202 Contact with and (suspected) exposure to infections with a predominantly sexual mode of transmission: Secondary | ICD-10-CM | POA: Insufficient documentation

## 2019-07-12 DIAGNOSIS — R360 Urethral discharge without blood: Secondary | ICD-10-CM | POA: Insufficient documentation

## 2019-07-12 DIAGNOSIS — Z114 Encounter for screening for human immunodeficiency virus [HIV]: Secondary | ICD-10-CM | POA: Insufficient documentation

## 2019-07-12 LAB — URINALYSIS, ROUTINE W REFLEX MICROSCOPIC
Bilirubin Urine: NEGATIVE
Glucose, UA: NEGATIVE mg/dL
Ketones, ur: NEGATIVE mg/dL
Nitrite: NEGATIVE
Protein, ur: NEGATIVE mg/dL
Specific Gravity, Urine: 1.024 (ref 1.005–1.030)
WBC, UA: >50 WBC/hpf — ABNORMAL HIGH (ref 0–5)
pH: 6 (ref 5.0–8.0)

## 2019-07-12 LAB — HIV ANTIBODY (ROUTINE TESTING W REFLEX): HIV Screen 4th Generation wRfx: NONREACTIVE

## 2019-07-12 MED ORDER — CEFTRIAXONE SODIUM 500 MG IJ SOLR
500.0000 mg | Freq: Once | INTRAMUSCULAR | Status: AC
  Administered 2019-07-12: 500 mg via INTRAMUSCULAR
  Filled 2019-07-12: qty 500

## 2019-07-12 MED ORDER — AZITHROMYCIN 250 MG PO TABS
1000.0000 mg | ORAL_TABLET | Freq: Once | ORAL | Status: AC
  Administered 2019-07-12: 1000 mg via ORAL
  Filled 2019-07-12: qty 4

## 2019-07-12 MED ORDER — CEPHALEXIN 500 MG PO CAPS
500.0000 mg | ORAL_CAPSULE | Freq: Four times a day (QID) | ORAL | 0 refills | Status: AC
Start: 2019-07-12 — End: 2019-07-17

## 2019-07-12 MED ORDER — LIDOCAINE HCL (PF) 1 % IJ SOLN
1.0000 mL | Freq: Once | INTRAMUSCULAR | Status: AC
  Administered 2019-07-12: 1 mL

## 2019-07-12 MED ORDER — METRONIDAZOLE 500 MG PO TABS
2000.0000 mg | ORAL_TABLET | Freq: Once | ORAL | Status: AC
  Administered 2019-07-12: 2000 mg via ORAL
  Filled 2019-07-12: qty 4

## 2019-07-12 NOTE — ED Provider Notes (Signed)
[facility] EMERGENCY DEPARTMENT Provider Note   CSN: [id number] Arrival date & time: 07/12/19  1048     History Chief Complaint  Patient presents with  . Exposure to STD    Richard Ortiz is a 24 y.o. male.  HPI   Patient presents to the emergency department with chief complaint of STI exposure.  Patient states he had unprotected sex about 2 weeks ago and 3 days ago he started to experience a burning sensation when he pees, discharge from his penis which he describes as yellow/clear color. He denies  testicular pain, fever, abdominal pain, nausea or vomiting.  Also denies any lesions on or around his genitalia, no associated rashes, no joint pain or eye pain.  Patient denies any sort of alleviating.  Patient does not have any significant medical history, does not take any medication on daily basis.  Patient denies headache, visual changes, nasal congestion, throat pain, chest pain, shortness of breath, abdominal pain, diarrhea, pedal edema.  He denies tobacco or marijuana use, denies drug use, drinks occasionally.  Past Medical History:  Diagnosis Date  . Abdominal hernia   . Asthma     Patient Active Problem List   Diagnosis Date Noted  . Ruptured appendicitis 08/07/2016  . SIRS (systemic inflammatory response syndrome) (HCC) 03/25/2012  . Facial cellulitis 03/25/2012  . Dental abscess 03/25/2012    Past Surgical History:  Procedure Laterality Date  . IR RADIOLOGIST EVAL & MGMT  08/19/2016       Family History  Problem Relation Age of Onset  . Hypertension Maternal Grandmother     Social History   Tobacco Use  . Smoking status: Never Smoker  . Smokeless tobacco: Never Used  Substance Use Topics  . Alcohol use: Yes  . Drug use: Yes    Types: Marijuana    Comment: 2-3 times a week    Home Medications Prior to Admission medications   Medication Sig Start Date End Date Taking? Authorizing Provider  cephALEXin (KEFLEX) 500 MG capsule  Take 1 capsule (500 mg total) by mouth 4 (four) times daily for 5 days. 07/12/19 07/17/19  Carroll Sage, PA-C    Allergies    Patient has no known allergies.  Review of Systems   Review of Systems  Constitutional: Negative for chills and fever.  HENT: Negative for congestion, ear discharge and sore throat.   Eyes: Negative for photophobia and pain.  Respiratory: Negative for cough and shortness of breath.   Cardiovascular: Negative for chest pain and leg swelling.  Gastrointestinal: Negative for abdominal pain, diarrhea, nausea and vomiting.  Genitourinary: Positive for discharge and dysuria. Negative for enuresis, flank pain, genital sores, hematuria, penile pain, penile swelling, scrotal swelling and testicular pain.  Musculoskeletal: Negative for back pain and joint swelling.  Skin: Negative for rash.  Neurological: Negative for dizziness and headaches.  Hematological: Does not bruise/bleed easily.    Physical Exam Updated Vital Signs BP 125/70 (BP Location: Right Arm)   Pulse 60   Temp 99 F (37.2 C) (Oral)   Resp 14   SpO2 100%   Physical Exam Vitals and nursing note reviewed. Exam conducted with a chaperone present.  Constitutional:      General: He is not in acute distress.    Appearance: He is not ill-appearing.  HENT:     Head: Normocephalic and atraumatic.     Nose: No congestion.     Mouth/Throat:     Mouth: Mucous membranes are moist.  Pharynx: Oropharynx is clear.  Eyes:     General: No scleral icterus.       Right eye: No discharge.        Left eye: No discharge.     Pupils: Pupils are equal, round, and reactive to light.  Cardiovascular:     Rate and Rhythm: Normal rate and regular rhythm.     Pulses: Normal pulses.     Heart sounds: No murmur heard.  No friction rub. No gallop.   Pulmonary:     Effort: No respiratory distress.     Breath sounds: No wheezing, rhonchi or rales.  Abdominal:     General: There is no distension.      Tenderness: There is no abdominal tenderness. There is no guarding.  Genitourinary:    Penis: Normal.      Testes: Normal.     Comments: Genital exam was performed with chaperone present, there was no lesions, rashes, discharge noted.  Testicles were visualized no gross abnormalities noted, nontender to palpation.  Shaft and head of penis were evaluated, no gross abnormalities, no discharge, no drainage nontender to palpation. Musculoskeletal:        General: No swelling.  Skin:    General: Skin is warm and dry.     Capillary Refill: Capillary refill takes less than 2 seconds.     Findings: No rash.  Neurological:     Mental Status: He is alert and oriented to person, place, and time.  Psychiatric:        Mood and Affect: Mood normal.     ED Results / Procedures / Treatments   Labs (all labs ordered are listed, but only abnormal results are displayed) Labs Reviewed  URINALYSIS, ROUTINE W REFLEX MICROSCOPIC - Abnormal; Notable for the following components:      Result Value   APPearance HAZY (*)    Hgb urine dipstick MODERATE (*)    Leukocytes,Ua MODERATE (*)    WBC, UA >50 (*)    Bacteria, UA RARE (*)    All other components within normal limits  HIV ANTIBODY (ROUTINE TESTING W REFLEX)  RPR  GC/CHLAMYDIA PROBE AMP ([place]) NOT AT [facility]    EKG None  Radiology No results found.  Procedures Procedures (including critical care time)  Medications Ordered in ED Medications  azithromycin (ZITHROMAX) tablet 1,000 mg (1,000 mg Oral Given 07/12/19 1549)  cefTRIAXone (ROCEPHIN) injection 500 mg (500 mg Intramuscular Given 07/12/19 1548)  lidocaine (PF) (XYLOCAINE) 1 % injection 1 mL (1 mL Other Given 07/12/19 1549)  metroNIDAZOLE (FLAGYL) tablet 2,000 mg (2,000 mg Oral Given 07/12/19 1549)  azithromycin (ZITHROMAX) tablet 1,000 mg (1,000 mg Oral Given 07/12/19 1618)    ED Course  I have reviewed the triage vital signs and the nursing notes.  Pertinent labs & imaging  results that were available during my care of the patient were reviewed by me and considered in my medical decision making (see chart for details).    MDM Rules/Calculators/A&P                          I have personally reviewed all imaging, labs and have interpreted them.  Due to patient's presentation most concern for UTI versus STD versus active arthritis.  unLikely patient is suffering from reactive arthritis as he denies eye pain, joint pain, rashes, fevers. examination of skin was benign no rashes or erythematous joints noted, no track marks noted and patient denies drug use.  Patient's UA showed moderate leukocytes, red blood cells, increased white blood cells, rare bacteria with symptoms of dysuria consistent with UTI will treat with antibiotics. Patient was tested for gonorrhea, chlamydia chlamydia, syphilis, and HIV. Patient will be given Rocephin and erythromycin to cover him for gonorrhea and chlamydia infection due to STI exposure. Patient was given another dose of azithromycin because he vomited up his first dose. He is also given Flagyl to treat him for possible trichomonas.  Patient appears to be resting comfortably in bed showing no signs of acute distress. Vital signs have remained stable does not meet criteria to be admitted to the hospital. Likely patient suffering from UTI versus STD. Patient will be given antibiotics to cover him for possible UTI and instructed to follow-up with primary care for reevaluation. Patient was discussed with attending who agrees with assessment and plan. Patient was given at home instruction as well as strict return precautions. Patient verbalized that he understood and agrees with said plan.   Final Clinical Impression(s) / ED Diagnoses Final diagnoses:  STD exposure  Acute cystitis without hematuria    Rx / DC Orders ED Discharge Orders         Ordered    cephALEXin (KEFLEX) 500 MG capsule  4 times daily     Discontinue  Reprint     07/12/19  1612           Carroll Sage, PA-C 07/12/19 1917    Tegeler, Canary Brim, MD 07/12/19 2150

## 2019-07-12 NOTE — ED Triage Notes (Signed)
To ED for eval of possible STD. States for past 3 days - with yellow discharge and pain. Pt states he has had known exposure. Appears in nad. No nausea. No vomiting.

## 2019-07-12 NOTE — Discharge Instructions (Addendum)
You have been treated for STD exposure and UTI.  You were given antibiotics here to cover you for gonorrhea and chlamydia.  I have also prescribed you antibiotics  for your UTI please take as prescribed.  Please drink plenty of water and stay hydrated as this will help with your UTI.  I need you to abstain from sexual intercourse for the next 14 days and have your partner evaluated for possible STDs.  Your HIV and syphilis are pending you will be notified of the results on MyChart.  I have given you information for community health and wellness they can provide you with a primary care provider please call to find one.  For future STI testing and treatment you can go to the health department where they will treat and evaluate you for free.  I want to come back to the emergency department if you develop fever, chills, chest pain, shortness of breath, increased testicular pain or swelling, uncontrolled nausea, vomiting as he symptoms require further evaluation.

## 2019-07-12 NOTE — ED Notes (Signed)
Pt had one episode of vomiting, EDP aware.  

## 2019-07-13 LAB — GC/CHLAMYDIA PROBE AMP (~~LOC~~) NOT AT ARMC
Chlamydia: POSITIVE — AB
Comment: NEGATIVE
Comment: NORMAL
Neisseria Gonorrhea: POSITIVE — AB

## 2019-07-13 LAB — RPR: RPR Ser Ql: NONREACTIVE

## 2019-11-20 ENCOUNTER — Ambulatory Visit (HOSPITAL_COMMUNITY)
Admission: EM | Admit: 2019-11-20 | Discharge: 2019-11-20 | Disposition: A | Discharge status: Home / Self Care | Payer: Self-pay | Attending: Family Medicine | Admitting: Family Medicine

## 2019-11-20 ENCOUNTER — Encounter (HOSPITAL_COMMUNITY): Payer: Self-pay

## 2019-11-20 ENCOUNTER — Other Ambulatory Visit: Payer: Self-pay

## 2019-11-20 DIAGNOSIS — Z113 Encounter for screening for infections with a predominantly sexual mode of transmission: Secondary | ICD-10-CM | POA: Insufficient documentation

## 2019-11-20 NOTE — ED Triage Notes (Signed)
Pt c/o testicular pain and tightness for approx 4 months and reports that his "semen doesn't seem right". States was tx for chlamydia and gonorrhea in June but girlfriend was not tx in June.  Pt denies penile discharge, abdominal pain, fever, n/v.

## 2019-11-20 NOTE — Discharge Instructions (Addendum)
We have sent testing for sexually transmitted infections. We will notify you of any positive results once they are received. If required, we will prescribe any medications you might need.  Please refrain from all sexual activity for at least the next seven days.  

## 2019-11-21 NOTE — ED Provider Notes (Signed)
[facility]   [id number] 11/20/19 Arrival Time: 1512  ASSESSMENT & PLAN:  1. Screening for STDs (sexually transmitted diseases)       Discharge Instructions     We have sent testing for sexually transmitted infections. We will notify you of any positive results once they are received. If required, we will prescribe any medications you might need.  Please refrain from all sexual activity for at least the next seven days.     Pending: Labs Reviewed  CYTOLOGY, (ORAL, ANAL, URETHRAL) ANCILLARY ONLY    Will notify of any positive results. Instructed to refrain from sexual activity for at least seven days.  Reviewed expectations re: course of current medical issues. Questions answered. Outlined signs and symptoms indicating need for more acute intervention. Patient verbalized understanding. After Visit Summary given.   SUBJECTIVE:  Richard Ortiz is a 24 y.o. male who reports being tx for gonorrhea and chlamydia a few months ago. Penile d/c resolved. Here for re-testing. Feeling well. One male sexual partner who is here for tx also.  ROS: As per HPI. All other systems negative.   OBJECTIVE:  Vitals:   11/20/19 1630  BP: (!) 146/86  Pulse: 83  Resp: 17  Temp: 98.4 F (36.9 C)  TempSrc: Oral  SpO2: 100%     General appearance: alert, cooperative, appears stated age and no distress Abdomen: soft, non-tender GU: normal appearing genitalia Skin: warm and dry Psychological: alert and cooperative; normal mood and affect.    Labs Reviewed  CYTOLOGY, (ORAL, ANAL, URETHRAL) ANCILLARY ONLY    No Known Allergies  Past Medical History:  Diagnosis Date  . Abdominal hernia   . Asthma    Family History  Problem Relation Age of Onset  . Hypertension Maternal Grandmother    Social History   Socioeconomic History  . Marital status: Single    Spouse name: Not on file  . Number of children: Not on file  . Years of education: Not on file  .  Highest education level: Not on file  Occupational History  . Not on file  Tobacco Use  . Smoking status: Never Smoker  . Smokeless tobacco: Never Used  Substance and Sexual Activity  . Alcohol use: Yes  . Drug use: Yes    Types: Marijuana    Comment: 2-3 times a week  . Sexual activity: Never  Other Topics Concern  . Not on file  Social History Narrative   ** Merged History Encounter **       Social Determinants of Health   Financial Resource Strain:   . Difficulty of Paying Living Expenses: Not on file  Food Insecurity:   . Worried About Programme researcher, broadcasting/film/video in the Last Year: Not on file  . Ran Out of Food in the Last Year: Not on file  Transportation Needs:   . Lack of Transportation (Medical): Not on file  . Lack of Transportation (Non-Medical): Not on file  Physical Activity:   . Days of Exercise per Week: Not on file  . Minutes of Exercise per Session: Not on file  Stress:   . Feeling of Stress : Not on file  Social Connections:   . Frequency of Communication with Friends and Family: Not on file  . Frequency of Social Gatherings with Friends and Family: Not on file  . Attends Religious Services: Not on file  . Active Member of Clubs or Organizations: Not on file  . Attends Banker Meetings: Not on  file  . Marital Status: Not on file  Intimate Partner Violence:   . Fear of Current or Ex-Partner: Not on file  . Emotionally Abused: Not on file  . Physically Abused: Not on file  . Sexually Abused: Not on file          Mardella Layman, MD 11/21/19 1313

## 2019-11-22 LAB — CYTOLOGY, (ORAL, ANAL, URETHRAL) ANCILLARY ONLY
Chlamydia: NEGATIVE
Comment: NEGATIVE
Comment: NEGATIVE
Comment: NORMAL
Neisseria Gonorrhea: NEGATIVE
Trichomonas: NEGATIVE

## 2020-06-01 ENCOUNTER — Other Ambulatory Visit: Payer: Self-pay

## 2020-06-01 ENCOUNTER — Emergency Department (HOSPITAL_COMMUNITY)
Admission: EM | Admit: 2020-06-01 | Discharge: 2020-06-01 | Disposition: A | Discharge status: Home / Self Care | Payer: Self-pay | Attending: Emergency Medicine | Admitting: Emergency Medicine

## 2020-06-01 ENCOUNTER — Encounter (HOSPITAL_COMMUNITY): Payer: Self-pay | Admitting: Emergency Medicine

## 2020-06-01 DIAGNOSIS — Z5321 Procedure and treatment not carried out due to patient leaving prior to being seen by health care provider: Secondary | ICD-10-CM | POA: Insufficient documentation

## 2020-06-01 DIAGNOSIS — R220 Localized swelling, mass and lump, head: Secondary | ICD-10-CM | POA: Insufficient documentation

## 2020-06-01 NOTE — ED Notes (Signed)
Patient called x2 with no response and not visible in lobby °

## 2020-06-01 NOTE — ED Notes (Signed)
Called for vitals recheck x1 with no response 

## 2020-06-01 NOTE — ED Triage Notes (Signed)
Pt with swelling and pain to his upper lip. States he ran in to a door, denies LOC.

## 2020-06-02 ENCOUNTER — Other Ambulatory Visit: Payer: Self-pay

## 2020-06-02 ENCOUNTER — Ambulatory Visit (HOSPITAL_COMMUNITY)
Admission: EM | Admit: 2020-06-02 | Discharge: 2020-06-02 | Disposition: A | Discharge status: Home / Self Care | Payer: Self-pay | Attending: Internal Medicine | Admitting: Internal Medicine

## 2020-06-02 ENCOUNTER — Encounter (HOSPITAL_COMMUNITY): Payer: Self-pay | Admitting: Emergency Medicine

## 2020-06-02 DIAGNOSIS — S01511A Laceration without foreign body of lip, initial encounter: Secondary | ICD-10-CM

## 2020-06-02 DIAGNOSIS — R220 Localized swelling, mass and lump, head: Secondary | ICD-10-CM

## 2020-06-02 MED ORDER — TETANUS-DIPHTH-ACELL PERTUSSIS 5-2.5-18.5 LF-MCG/0.5 IM SUSY: 0.5000 mL | PREFILLED_SYRINGE | Freq: Once | INTRAMUSCULAR | Status: DC

## 2020-06-02 MED ORDER — IBUPROFEN 800 MG PO TABS: 800.0000 mg | ORAL_TABLET | Freq: Once | ORAL | Status: DC

## 2020-06-02 NOTE — ED Provider Notes (Signed)
[facility]    CSN: [id number] Arrival date & time: 06/02/20  0818      History   Chief Complaint Chief Complaint  Patient presents with  . Mouth Injury    HPI Plumer D Sima Matas is a 25 y.o. male presents to urgent care today with injury to mouth.  Injury occurred yesterday and patient left ER without being seen due to long wait.  Patient states he was elbowed in the mouth while playing basketball.  He reports pain and swelling to left worse upon waking up this morning.  Patient denies any LOC, no loose dentition, pain only in lip and no facial pain.  Patient has not taken anything for pain, has not iced area.   Past Medical History:  Diagnosis Date  . Abdominal hernia   . Asthma     Patient Active Problem List   Diagnosis Date Noted  . Ruptured appendicitis 08/07/2016  . SIRS (systemic inflammatory response syndrome) (HCC) 03/25/2012  . Facial cellulitis 03/25/2012  . Dental abscess 03/25/2012    Past Surgical History:  Procedure Laterality Date  . IR RADIOLOGIST EVAL & MGMT  08/19/2016       Home Medications    Prior to Admission medications   Not on File    Family History Family History  Problem Relation Age of Onset  . Hypertension Maternal Grandmother     Social History Social History   Tobacco Use  . Smoking status: Never Smoker  . Smokeless tobacco: Never Used  Substance Use Topics  . Alcohol use: Yes  . Drug use: Yes    Types: Marijuana    Comment: 2-3 times a week     Allergies   Patient has no known allergies.   Review of Systems As stated in HPI otherwise negative   Physical Exam Triage Vital Signs ED Triage Vitals  Enc Vitals Group     BP 06/02/20 0843 138/81     Pulse Rate 06/02/20 0843 84     Resp --      Temp 06/02/20 0843 98.3 F (36.8 C)     Temp Source 06/02/20 0843 Oral     SpO2 06/02/20 0843 100 %     Weight --      Height --      Head Circumference --      Peak Flow --      Pain Score  06/02/20 0845 9     Pain Loc --      Pain Edu? --      Excl. in GC? --    No data found.  Updated Vital Signs BP 138/81 (BP Location: Left Arm)   Pulse 84   Temp 98.3 F (36.8 C) (Oral)   SpO2 100%   Visual Acuity Right Eye Distance:   Left Eye Distance:   Bilateral Distance:    Right Eye Near:   Left Eye Near:    Bilateral Near:     Physical Exam Constitutional:      General: He is not in acute distress.    Appearance: Normal appearance. He is not ill-appearing or toxic-appearing.  HENT:     Head: Normocephalic.     Jaw: There is normal jaw occlusion. No tenderness, swelling or pain on movement.      Comments: Patient with significant swelling to upper lip with small abrasion to right upper lip line and approximately 3 cm laceration with swelling to upper mid lip.  Dentition normal, tongue midline without swelling  Nose: Nose normal.  Musculoskeletal:     Cervical back: Normal range of motion.  Neurological:     Mental Status: He is alert.      UC Treatments / Results  Labs (all labs ordered are listed, but only abnormal results are displayed) Labs Reviewed - No data to display  EKG   Radiology No results found.  Procedures Procedures (including critical care time)  Medications Ordered in UC Medications  ibuprofen (ADVIL) tablet 800 mg (has no administration in time range)  Tdap (BOOSTRIX) injection 0.5 mL (has no administration in time range)    Initial Impression / Assessment and Plan / UC Course  I have reviewed the triage vital signs and the nursing notes.  Pertinent labs & imaging results that were available during my care of the patient were reviewed by me and considered in my medical decision making (see chart for details).  Lip laceration with swelling -secondary to trauma -no bone tenderness or instability -No indication for laceration repair -Frequent ice, NSAIDs as needed -Tdap  Reviewed expections re: course of current medical  issues. Questions answered. Outlined signs and symptoms indicating need for more acute intervention. Pt verbalized understanding. AVS given   Final Clinical Impressions(s) / UC Diagnoses   Final diagnoses:  Lip laceration, initial encounter  Swelling of upper lip     Discharge Instructions     Ice lip frequently as we discussed.  You can take ibuprofen 800 mg every 6-8 hours as needed for pain.  This should help improve the pain and swelling over the next few days.  Apply Aquaphor to lip for comfort.  Please return for any worsening symptoms    ED Prescriptions    None     PDMP not reviewed this encounter.   Rolla Etienne, NP 06/02/20 [phone]

## 2020-06-02 NOTE — ED Triage Notes (Signed)
Patient states that he was playing basketball yesterday and got hit in the mouth.  Very painful.  Denies OTC meds.

## 2020-06-02 NOTE — Discharge Instructions (Addendum)
Ice lip frequently as we discussed.  You can take ibuprofen 800 mg every 6-8 hours as needed for pain.  This should help improve the pain and swelling over the next few days.  Apply Aquaphor to lip for comfort.  Please return for any worsening symptoms

## 2020-06-03 ENCOUNTER — Encounter (HOSPITAL_COMMUNITY): Payer: Self-pay | Admitting: Emergency Medicine

## 2020-06-03 ENCOUNTER — Other Ambulatory Visit: Payer: Self-pay

## 2020-06-03 ENCOUNTER — Ambulatory Visit (HOSPITAL_COMMUNITY)
Admission: EM | Admit: 2020-06-03 | Discharge: 2020-06-03 | Disposition: A | Discharge status: Home / Self Care | Payer: Self-pay

## 2020-06-03 DIAGNOSIS — S01511S Laceration without foreign body of lip, sequela: Secondary | ICD-10-CM

## 2020-06-03 DIAGNOSIS — S01511D Laceration without foreign body of lip, subsequent encounter: Secondary | ICD-10-CM

## 2020-06-03 DIAGNOSIS — S01511A Laceration without foreign body of lip, initial encounter: Secondary | ICD-10-CM | POA: Diagnosis present

## 2020-06-03 MED ORDER — CEFTRIAXONE SODIUM 500 MG IJ SOLR
INTRAMUSCULAR | Status: AC
  Filled 2020-06-03: qty 500

## 2020-06-03 MED ORDER — LIDOCAINE HCL (PF) 1 % IJ SOLN
INTRAMUSCULAR | Status: AC
  Filled 2020-06-03: qty 2

## 2020-06-03 MED ORDER — AMOXICILLIN-POT CLAVULANATE 875-125 MG PO TABS
1.0000 | ORAL_TABLET | Freq: Two times a day (BID) | ORAL | 0 refills | Status: AC
Start: 2020-06-03 — End: 2020-06-10

## 2020-06-03 MED ORDER — CEFTRIAXONE SODIUM 500 MG IJ SOLR
500.0000 mg | Freq: Once | INTRAMUSCULAR | Status: AC
  Administered 2020-06-03: 500 mg via INTRAMUSCULAR

## 2020-06-03 MED ORDER — ACETAMINOPHEN 325 MG PO TABS
650.0000 mg | ORAL_TABLET | Freq: Once | ORAL | Status: AC
  Administered 2020-06-03: 650 mg via ORAL

## 2020-06-03 MED ORDER — CHLORHEXIDINE GLUCONATE 0.12 % MT SOLN: 15.0000 mL | Freq: Two times a day (BID) | OROMUCOSAL | 0 refills | Status: AC

## 2020-06-03 MED ORDER — ACETAMINOPHEN 325 MG PO TABS
ORAL_TABLET | ORAL | Status: AC
  Filled 2020-06-03: qty 3

## 2020-06-03 MED ORDER — HYDROCODONE-ACETAMINOPHEN 5-325 MG PO TABS: 1.0000 | ORAL_TABLET | Freq: Four times a day (QID) | ORAL | 0 refills | Status: AC | PRN

## 2020-06-03 NOTE — Discharge Instructions (Addendum)
The wound in your lip has become infected.  1.  You can take Tylenol 500 mg and ibuprofen 200 mg together 3 times daily.  You can also opt to take only 1 of these medications for pain. 2.  I have sent a prescription for a stronger pain medicine to the [facility] on [facility].  You do not have a physical prescription for this because it has been sent in electronically.  The prescription is for a medication called Vicodin or hydrocodone/acetaminophen.  You can take this medication once every 6-8 hours as needed for severe pain.  I recommend that before you take it you also take Tylenol/ibuprofen to help reduce the pain first. 3.  You have been prescribed amoxicillin/potassium clavulanate (Augmentin) 875/125 mg tablet to be taken twice daily for the next 7 days.  Please take all this medication, even if you start to improve in the first couple of days. 4.  I have also written you a prescription for a mouth rinse called.  Ask you can use to keep the inside of your mouth clean and to help clean the wound twice daily.  You received a shot of Rocephin/ceftriaxone antibiotic to help reduce your infection.  Should your symptoms not improve and the next 5 days, or should your symptoms worsen despite her interventions, please come back to see [place] or seek emergency medical care in the emergency department.  I have provided the contact information for the local [facility] ENT doctor's office.  You can call to schedule an appointment with them to follow-up as needed (for example, if your lip heals but does not have satisfactory cosmetic result.)

## 2020-06-03 NOTE — ED Triage Notes (Signed)
Pt presents today with continued complaint of top lip swelling with pain x 3 days. He was seen here yesterday for same. He believes the area has become infected.

## 2020-06-03 NOTE — ED Provider Notes (Signed)
[facility]    CSN: [id number] Arrival date & time: 06/03/20  0865      History   Chief Complaint Chief Complaint  Patient presents with  . Oral Swelling    HPI Richard Ortiz is a 25 y.o. male who presents to urgent care today with a mouth injury.  The injury occurred 2 days ago on 06/01/2020.  Patient was playing basketball and elbow hit his lip into one of his teeth.  This caused his tooth to "pierce his lip".  Since then he has had swelling of the lip that has gotten worse. He visited urgent care in the next day/yesterday with concern for pain and swelling of his upper lip when he woke up the morning of 5/16.  It was recommended that the patient take ibuprofen for pain to help reduce swelling.  Patient was also given Tdap shot.  Today the patient reports that his upper lip has become significantly more swollen and very tender.  He reports that the pain is making it difficult for him to eat/drink or brush his teeth. He also reports some drainage. He has been icing his upper lip as instructed; However, the swelling has worsened.  He currently denies fevers, body aches, chills, vision changes, swelling of his face, sore throat, chest pain, shortness of breath, nausea, vomiting, abdominal pain, rashes, weakness.  Past Medical History:  Diagnosis Date  . Abdominal hernia   . Asthma     Patient Active Problem List   Diagnosis Date Noted  . Lip laceration 06/03/2020  . Ruptured appendicitis 08/07/2016  . SIRS (systemic inflammatory response syndrome) (HCC) 03/25/2012  . Facial cellulitis 03/25/2012  . Dental abscess 03/25/2012   Past Surgical History:  Procedure Laterality Date  . IR RADIOLOGIST EVAL & MGMT  08/19/2016   Home Medications    Prior to Admission medications   Medication Sig Start Date End Date Taking? Authorizing Provider  amoxicillin-clavulanate (AUGMENTIN) 875-125 MG tablet Take 1 tablet by mouth 2 (two) times daily for 7 days. 06/03/20 06/10/20  Yes Dollene Cleveland, DO  chlorhexidine (PERIDEX) 0.12 % solution Use as directed 15 mLs in the mouth or throat 2 (two) times daily. 06/03/20  Yes Dollene Cleveland, DO  HYDROcodone-acetaminophen (NORCO/VICODIN) 5-325 MG tablet Take 1 tablet by mouth every 6 (six) hours as needed for moderate pain. 06/03/20  Yes Peggyann Shoals C, DO  ibuprofen (ADVIL) 200 MG tablet Take 200 mg by mouth every 6 (six) hours as needed.   Yes [provider]    Family History Family History  Problem Relation Age of Onset  . Hypertension Maternal Grandmother     Social History Social History   Tobacco Use  . Smoking status: Never Smoker  . Smokeless tobacco: Never Used  Substance Use Topics  . Alcohol use: Yes  . Drug use: Yes    Types: Marijuana    Comment: 2-3 times a week     Allergies   Patient has no known allergies.   Review of Systems Review of Systems - see HPI   Physical Exam Triage Vital Signs ED Triage Vitals  Enc Vitals Group     BP 06/03/20 1119 130/76     Pulse Rate 06/03/20 1119 75     Resp 06/03/20 1119 18     Temp 06/03/20 1119 99 F (37.2 C)     Temp Source 06/03/20 1119 Oral     SpO2 06/03/20 1119 96 %     Weight --  Height --      Head Circumference --      Peak Flow --      Pain Score 06/03/20 1117 9     Pain Loc --      Pain Edu? --      Excl. in GC? --    No data found.  Updated Vital Signs BP 130/76 (BP Location: Left Arm)   Pulse 75   Temp 99 F (37.2 C) (Oral)   Resp 18   SpO2 96%   Physical Exam Constitutional:      Appearance: Normal appearance. He is not ill-appearing or diaphoretic.  HENT:     Head: Normocephalic.     Nose: Nose normal.     Mouth/Throat:     Mouth: Mucous membranes are moist.     Comments: Swelling and pustular drainage to upper lip, see photo below Eyes:     Extraocular Movements: Extraocular movements intact.     Conjunctiva/sclera: Conjunctivae normal.  Cardiovascular:     Rate and Rhythm: Normal  rate and regular rhythm.  Pulmonary:     Breath sounds: Normal breath sounds.  Musculoskeletal:        General: Normal range of motion.  Lymphadenopathy:     Cervical: No cervical adenopathy.  Skin:    General: Skin is warm and dry.     Capillary Refill: Capillary refill takes less than 2 seconds.  Neurological:     General: No focal deficit present.     Mental Status: He is alert.        UC Treatments / Results  Labs (all labs ordered are listed, but only abnormal results are displayed) Labs Reviewed - No data to display  EKG  Radiology No results found.  Procedures Procedures (including critical care time)  Medications Ordered in UC Medications  cefTRIAXone (ROCEPHIN) injection 500 mg (500 mg Intramuscular Given 06/03/20 1215)  acetaminophen (TYLENOL) tablet 650 mg (650 mg Oral Given 06/03/20 1212)    Initial Impression / Assessment and Plan / UC Course  I have reviewed the triage vital signs and the nursing notes.  Pertinent labs & imaging results that were available during my care of the patient were reviewed by me and considered in my medical decision making (see chart for details).    Final Clinical Impressions(s) / UC Diagnoses   Final diagnoses:  Lip laceration, sequela     Discharge Instructions     The wound in your lip has become infected.  1.  You can take Tylenol 500 mg and ibuprofen 200 mg together 3 times daily.  You can also opt to take only 1 of these medications for pain. 2.  I have sent a prescription [facility].  You do not have a physical prescription for this because it has been sent in electronically.  The prescription is for a medication called Vicodin or hydrocodone/acetaminophen.  You can take this medication once every 6-8 hours as needed for severe pain.  I recommend that before you take it you also take Tylenol/ibuprofen to help reduce the pain first. 3.  You have been prescribed  amoxicillin/potassium clavulanate (Augmentin) 875/125 mg tablet to be taken twice daily for the next 7 days.  Please take all this medication, even if you start to improve in the first couple of days. 4.  I have also written you a prescription for a mouth rinse called.  Ask you can use to keep the inside  of your mouth clean and to help clean the wound twice daily.  You received a shot of Rocephin/ceftriaxone antibiotic to help reduce your infection.  Should your symptoms not improve and the next 5 days, or should your symptoms worsen despite her interventions, please come back to see [place] or seek emergency medical care in the emergency department.  I have provided the contact information for the local [facility] ENT doctor's office.  You can call to schedule an appointment with them to follow-up as needed (for example, if your lip heals but does not have satisfactory cosmetic result.)   ED Prescriptions    Medication Sig Dispense Auth. Provider   amoxicillin-clavulanate (AUGMENTIN) 875-125 MG tablet Take 1 tablet by mouth 2 (two) times daily for 7 days. 14 tablet Peggyann Shoals C, DO   HYDROcodone-acetaminophen (NORCO/VICODIN) 5-325 MG tablet Take 1 tablet by mouth every 6 (six) hours as needed for moderate pain. 12 tablet Peggyann Shoals C, DO   chlorhexidine (PERIDEX) 0.12 % solution Use as directed 15 mLs in the mouth or throat 2 (two) times daily. 473 mL Dollene Cleveland, DO     I have reviewed the PDMP during this encounter.   Dollene Cleveland, DO 06/03/20 2043

## 2020-07-18 DEATH — deceased
